# Patient Record
Sex: Male | Born: 1960 | Race: White | Hispanic: No | Marital: Single
Health system: Midwestern US, Academic
[De-identification: ages and names within clinical notes are randomized; demographics above are authoritative.]

---

## 2022-03-09 ENCOUNTER — Encounter: Admit: 2022-03-09 | Discharge: 2022-03-09 | Payer: BC Managed Care – HMO

## 2022-03-09 ENCOUNTER — Ambulatory Visit: Admit: 2022-03-09 | Discharge: 2022-03-09 | Payer: BC Managed Care – HMO

## 2022-03-09 DIAGNOSIS — J189 Pneumonia, unspecified organism: Secondary | ICD-10-CM

## 2022-03-13 ENCOUNTER — Encounter: Admit: 2022-03-13 | Discharge: 2022-03-13 | Payer: BC Managed Care – HMO

## 2022-03-13 ENCOUNTER — Ambulatory Visit: Admit: 2022-03-13 | Discharge: 2022-03-13 | Payer: BC Managed Care – HMO

## 2022-03-13 DIAGNOSIS — I10 Essential (primary) hypertension: Secondary | ICD-10-CM

## 2022-03-13 DIAGNOSIS — I890 Lymphedema, not elsewhere classified: Secondary | ICD-10-CM

## 2022-03-13 DIAGNOSIS — E785 Hyperlipidemia, unspecified: Secondary | ICD-10-CM

## 2022-03-13 DIAGNOSIS — E872 Acidosis: Secondary | ICD-10-CM

## 2022-03-13 DIAGNOSIS — L0390 Cellulitis, unspecified: Secondary | ICD-10-CM

## 2022-03-13 DIAGNOSIS — D72829 Elevated white blood cell count, unspecified: Secondary | ICD-10-CM

## 2022-03-13 DIAGNOSIS — E119 Type 2 diabetes mellitus without complications: Secondary | ICD-10-CM

## 2022-03-13 DIAGNOSIS — J189 Pneumonia, unspecified organism: Secondary | ICD-10-CM

## 2022-03-14 ENCOUNTER — Inpatient Hospital Stay: Admit: 2022-03-14 | Discharge: 2022-03-14 | Payer: BC Managed Care – HMO

## 2022-03-14 ENCOUNTER — Encounter: Admit: 2022-03-14 | Discharge: 2022-03-14 | Payer: BC Managed Care – HMO

## 2022-03-14 ENCOUNTER — Inpatient Hospital Stay: Admit: 2022-03-14 | Payer: BC Managed Care – HMO

## 2022-03-14 DIAGNOSIS — I10 Essential (primary) hypertension: Secondary | ICD-10-CM

## 2022-03-14 DIAGNOSIS — E119 Type 2 diabetes mellitus without complications: Secondary | ICD-10-CM

## 2022-03-14 DIAGNOSIS — A419 Sepsis, unspecified organism: Secondary | ICD-10-CM

## 2022-03-14 LAB — CBC AND DIFF
ABSOLUTE BASO COUNT: 0 K/UL (ref 0–0.20)
ABSOLUTE EOS COUNT: 0.1 K/UL (ref 0–0.45)
ABSOLUTE MONO COUNT: 0.8 K/UL — ABNORMAL HIGH (ref 0–0.80)
PLATELET COUNT: 225 K/UL (ref 150–400)
RBC COUNT: 4.3 M/UL — ABNORMAL LOW (ref 4.4–5.5)
WBC COUNT: 14 K/UL — ABNORMAL HIGH (ref 4.5–11.0)

## 2022-03-14 LAB — COMPREHENSIVE METABOLIC PANEL
ALK PHOSPHATASE: 368 U/L — ABNORMAL HIGH (ref 25–110)
ALT: 99 U/L — ABNORMAL HIGH (ref 7–56)
ANION GAP: 11 K/UL — ABNORMAL HIGH (ref 3–12)
AST: 121 U/L — ABNORMAL HIGH (ref 7–40)
BLD UREA NITROGEN: 16 mg/dL (ref 7–25)
CALCIUM: 8.8 mg/dL (ref 8.5–10.6)
CHLORIDE: 94 MMOL/L — ABNORMAL LOW (ref 98–110)
CO2: 33 MMOL/L — ABNORMAL HIGH (ref 21–30)
CREATININE: 0.7 mg/dL (ref 0.4–1.24)
EGFR: >60 mL/min — ABNORMAL LOW (ref >60)
GLUCOSE,PANEL: 194 mg/dL — ABNORMAL HIGH (ref 70–100)
POTASSIUM: 3.5 MMOL/L — ABNORMAL LOW (ref 3.5–5.1)

## 2022-03-14 LAB — POC GLUCOSE
POC GLUCOSE: 222 mg/dL — ABNORMAL HIGH (ref 70–100)
POC GLUCOSE: 197 mg/dL — ABNORMAL HIGH (ref 70–100)
POC GLUCOSE: 160 mg/dL — ABNORMAL HIGH (ref 70–100)
POC GLUCOSE: 155 mg/dL — ABNORMAL HIGH (ref 70–100)

## 2022-03-14 LAB — SED RATE: ESR: 71 mm/h — ABNORMAL HIGH (ref 0–20)

## 2022-03-14 LAB — C REACTIVE PROTEIN (CRP): C-REACTIVE PROTEIN: 19 mg/dL — ABNORMAL HIGH (ref <1.0)

## 2022-03-14 MED ORDER — OXYCODONE 10 MG PO TAB
5 mg | ORAL | 0 refills | Status: AC | PRN
Start: 2022-03-14 — End: ?
  Administered 2022-03-14 – 2022-03-15 (×2): 5 mg via ORAL

## 2022-03-14 MED ORDER — ACETAMINOPHEN 325 MG PO TAB
650 mg | ORAL | 0 refills | Status: AC | PRN
Start: 2022-03-14 — End: ?
  Administered 2022-03-14 – 2022-03-16 (×3): 650 mg via ORAL

## 2022-03-14 MED ORDER — ONDANSETRON HCL (PF) 4 MG/2 ML IJ SOLN
4 mg | INTRAVENOUS | 0 refills | Status: AC | PRN
Start: 2022-03-14 — End: ?
  Administered 2022-03-19: 16:00:00 4 mg via INTRAVENOUS

## 2022-03-14 MED ORDER — DEXTROSE 50 % IN WATER (D50W) IV SYRG
12.5-25 g | INTRAVENOUS | 0 refills | Status: AC | PRN
Start: 2022-03-14 — End: ?

## 2022-03-14 MED ORDER — AMLODIPINE 5 MG PO TAB
5 mg | Freq: Every day | ORAL | 0 refills | Status: AC
Start: 2022-03-14 — End: ?
  Administered 2022-03-15 – 2022-03-24 (×9): 5 mg via ORAL

## 2022-03-14 MED ORDER — ATORVASTATIN 40 MG PO TAB
40 mg | Freq: Every evening | ORAL | 0 refills | Status: AC
Start: 2022-03-14 — End: ?
  Administered 2022-03-15: 03:00:00 40 mg via ORAL

## 2022-03-14 MED ORDER — SENNOSIDES-DOCUSATE SODIUM 8.6-50 MG PO TAB
1 | Freq: Every day | ORAL | 0 refills | Status: AC | PRN
Start: 2022-03-14 — End: ?

## 2022-03-14 MED ORDER — ONDANSETRON 4 MG PO TBDI
4 mg | ORAL | 0 refills | Status: AC | PRN
Start: 2022-03-14 — End: ?

## 2022-03-14 MED ORDER — TORSEMIDE 20 MG PO TAB
40 mg | Freq: Two times a day (BID) | ORAL | 0 refills | Status: AC
Start: 2022-03-14 — End: ?
  Administered 2022-03-14 – 2022-03-24 (×18): 40 mg via ORAL

## 2022-03-14 MED ORDER — CARVEDILOL 25 MG PO TAB
25 mg | Freq: Two times a day (BID) | ORAL | 0 refills | Status: AC
Start: 2022-03-14 — End: ?
  Administered 2022-03-15 – 2022-03-17 (×5): 25 mg via ORAL

## 2022-03-14 MED ORDER — INSULIN ASPART 100 UNIT/ML SC FLEXPEN
0-12 [IU] | Freq: Before meals | SUBCUTANEOUS | 0 refills | Status: AC
Start: 2022-03-14 — End: ?
  Administered 2022-03-14: 18:00:00 2 [IU] via SUBCUTANEOUS

## 2022-03-14 MED ORDER — VANCOMYCIN PHARMACY TO MANAGE
1 | 0 refills | Status: DC
Start: 2022-03-14 — End: 2022-03-14

## 2022-03-14 MED ORDER — INSULIN GLARGINE 100 UNIT/ML (3 ML) SC INJ PEN
30 [IU] | Freq: Two times a day (BID) | SUBCUTANEOUS | 0 refills | Status: AC
Start: 2022-03-14 — End: ?
  Administered 2022-03-15 – 2022-03-18 (×2): 30 [IU] via SUBCUTANEOUS

## 2022-03-14 MED ORDER — ENOXAPARIN 40 MG/0.4 ML SC SYRG
40 mg | Freq: Two times a day (BID) | SUBCUTANEOUS | 0 refills | Status: AC
Start: 2022-03-14 — End: ?
  Administered 2022-03-15 – 2022-03-18 (×4): 40 mg via SUBCUTANEOUS

## 2022-03-14 MED ORDER — GUAIFENESIN 600 MG PO TA12
600 mg | Freq: Two times a day (BID) | ORAL | 0 refills | Status: AC
Start: 2022-03-14 — End: ?
  Administered 2022-03-14 – 2022-03-24 (×20): 600 mg via ORAL

## 2022-03-14 MED ORDER — POLYETHYLENE GLYCOL 3350 17 GRAM PO PWPK
1 | Freq: Every day | ORAL | 0 refills | Status: AC | PRN
Start: 2022-03-14 — End: ?

## 2022-03-14 MED ORDER — PIPERACILLIN/TAZOBACTAM 4.5 G/100ML NS IVPB (MB+)
4.5 g | INTRAVENOUS | 0 refills | Status: AC
Start: 2022-03-14 — End: ?
  Administered 2022-03-14 – 2022-03-20 (×44): 4.5 g via INTRAVENOUS

## 2022-03-14 MED ORDER — ALBUTEROL SULFATE 2.5 MG/0.5 ML IN NEBU
2.5 mg | RESPIRATORY_TRACT | 0 refills | Status: AC | PRN
Start: 2022-03-14 — End: ?

## 2022-03-14 MED ORDER — VANCOMYCIN 2,000 MG IVPB
15 mg/kg | Freq: Two times a day (BID) | INTRAVENOUS | 0 refills | Status: DC
Start: 2022-03-14 — End: 2022-03-14

## 2022-03-14 MED ORDER — ALLOPURINOL 100 MG PO TAB
300 mg | Freq: Every day | ORAL | 0 refills | Status: AC
Start: 2022-03-14 — End: ?
  Administered 2022-03-15 – 2022-03-24 (×9): 300 mg via ORAL

## 2022-03-14 MED ORDER — MELATONIN 5 MG PO TAB
5 mg | Freq: Every evening | ORAL | 0 refills | Status: AC | PRN
Start: 2022-03-14 — End: ?

## 2022-03-14 MED ADMIN — SODIUM CHLORIDE 0.9 % IV SOLP [27838]: 250 mL | INTRAVENOUS | @ 20:00:00 | Stop: 2022-03-14 | NDC 00338004902

## 2022-03-14 NOTE — Consults
Cardiothoracic Surgery Consult  Date of Service: 03/14/2022    Requesting Physician: Dr. Charolett Bumpers  Consulting Physician:[name]  Consult Performed By:  Italy Sedale Jenifer, DO    HPI:               61yo male admitted from OSH with persistent bacteremia and imaging concerning for mediastinitis and phlegmon of the [facility] joint/lateral neck. Patient notes hx of diabetes. Reports pain and swelling of the right neck that has worsened over the last week. Denies chest pain, denies shortness of breath. Denies hx of similar episodes of current symptoms.     Impression:  There are no active hospital problems to display for this patient.       Plan:  Imaging consistent with SC joint infection  Would recommend infectious disease consultation with aggressive IV abx  Will follow clinical course and physical exam, may ultimately require surgical debridement.    C Georgeanna Radziewicz  CTS Fellow        No past medical history on file.    No past surgical history on file.     No medications prior to admission.        Allergies   Allergen Reactions   ? Sulfur ITCHING       No family history on file.    Social History     Socioeconomic History   ? Marital status: Single       ROS:  Constitutional: Negative for Fatigue, Weight Change, Fevers  Eyes, Ears, Nose And Throat: Negative for Change in vision, Change in Hearing   Cardiovascular: Negative for Chest Pain, Palpitations, Swelling in ankles  Respiratory: Negative for Cough, Shortness of Breath, wheezing  Gastrointestinal: Negative for Nausea, indigestion, Diarrhea, Constipation, Rectal Bleeding   Neurological: Positive for headache   Psychological: Negative for depression or anxiety  Musculoskeletal: Negative for Pain or Swelling in joints  Genitourinary: Negative for Pain with urination, Incontinence of urine, urinary frequency    Skin: Negative for any unusual rash  Endocrine: Negative for any hair, skin, or nail changes, Unusual hunger or thirst    Physical Exam:  Temp: 37.2 ?C (99 ?F) (08/19 1034)  Pulse: 74 (08/19 1034)  Respirations: 20 PER MINUTE (08/19 1034)  BP: 127/72 (08/19 1034)      GENERAL:   A&O x 3, NAD, appears stated age  HEENT:  Right supraclavicular space with significant induration and tenderness in comparison to the left.  NECK:  No masses, normal ROM, trachea midline  CARDIOVASCULAR:  RRR  LUNGS:  chest symmetrical with respirations  ABDOMEN:  Soft, NT  SKIN:   No lesions, rashes or wounds  MUSCULOSKELETAL:  No muscle atrophy, normal joint range of motion   NEUROLOGIC:  Grossly intact     Results for orders placed or performed during the hospital encounter of 03/14/22 (from the past 24 hour(s))   POC GLUCOSE    Collection Time: 03/14/22 11:33 AM   # # Low-High    Glucose, POC 222 (H) 70 - 100 MG/DL

## 2022-03-14 NOTE — H&P (View-Only)
Admission History and Physical Examination      Name:  Jon Pope                                             MRN:  [id number]   Admission Date:  03/14/2022                     Assessment/Plan:    Principal Problem:    Streptococcal bacteremia  Active Problems:    Type 2 diabetes mellitus, with long-term current use of insulin (HCC)    Primary hypertension    HLD (hyperlipidemia)    Gout    Lymphedema due to venous insufficiency    Chronic right shoulder pain    Left lower lobe pneumonia      61 y.o. male with PMH   HTN, Type 2 DM, HLD, Obesity, chronic right shoulder pain and Lymphedema/BLE edema, venous insufficiciency presented to OSH 8/13 with 3 days of generalized  malaise, weakness, nausea/vomiting, fevers, some neck pain, headaches,congestion and coughing.  Patient found to have a LLL pneumonia, Group G Strep Bacteremia, Right neck cellulitis/phlegmon, with concern for anterior Mediastinitis and osteomyelitis.    Group G Strep bacteremia  - WBC (8/13) 10.16-->  - Procal (8/13) 1.88--> 0.37 ( 8/18)  - Lacate ( 8/13) 2.8-->1.5 ( 8/14)  - Covid (8/13) negative  - Blood culture ( 8/13) aerobic and anerobic culture positive for Group G Streptococcus (pansensitive- Amp/Ceftraxone/Levofloxacin/PCN/Vanco)  - OSH Blood culture ( 8/15)- no growth to date  - UA ( 8/13) with No overt concern for infection. +1 pro, +1blood, neg nitrite and leukocytes.  - Echo ( 8/18) with LVEF 65-70%. No evidence of valvular abnormalities on spectral doppler.  Consider TEE for improved sensitivity for detection of valvular vegetations if clinically indicated. No pericardial effusion  - per documentation on 8/18, patient continued to fever despite treatment.  - Antibiotics         - Levaquin 8/13- 8/18       - Rocephin ( 8/18)       -Azithomycin 500mg  PO  ( 8/18) added for PNA        -Vancomycin IV ( 8/18-*8/19)       -Zosyn ( 8/18)  Plan  - ID consulted  - Continue Zosyn ( 8/18- present)  - Repeat blood cultures, CRP and admission labs  - Will need to follow up with OSH blood culture draw from 8/15 and 8/18 ( [facility] on [address])  - Per ID, obtain Right neck ultrasound, Panorex and TEE      Right and Posterior neck cellulitis/phelgmon  Osteomyelitis/ mediastinitis  Cervical Lymphadenopathy  -Reports neck pain with notable erythema ( marked) for 3 day prior to admission at the OSH 8/13  -CT head (03/08/2022) with no acute intracranial abnormality.  -CT soft tissue neck wo/w contrast (03/09/2022) mild skin thickening with inflammatory stranding and muscle edema involving the right side of the neck.  Findings are suspicious for cellulitis/myositis secondary to an infectious etiology.  No definitive drainable abscess at this time.   -MRI cervical and thoracic spine with right neck infection extending into the prevertebral soft tissues and anterior mediastinum without definitive drainable abcess.  -OSH MRI c-spine w/wo ( 8/18) with skeletal hyperostosis and prevertebral edema and disruption of the anterior longitudinal ligament at the level of the C6-C7.  -OSH  MRI Thoracic spine w/wo ( 8/18) with infection within the right carotid space and right side of the neck there is extension into the thoracic inlet with a small right pleural effusion.  -OSH CT chest ( 8/18) revealed phlegmon involving the right side of the neck encasing the manubrium of the sternum and extending into the anterior mediastrinum and right lung apex. There are foci of air within the anterior mediastinum. Sclerosis and erosions involving the posterior cortex of the manubrium appreciated. Finding are suspicious of osteomyelitis and mediastinitis.  -OSH CT abdomen/pelvis (8/18) with hepatomegaly (23.8 cm) with moderate steatosis.  Mild hepatic surface nodularity.  Enlarged spleen (18 cm).  Pancreas and adrenal glands unremarkable.  Cholelithiasis.  No hydronephrosis.  No bowel obstruction or free air.  Normal appendix.  Mild gastric mucosal thickening.  No significant small bowel dilatation.  Mild arthrosclerotic disease within the abdominal aorta.  Subcentimeter retroperitoneal nodules.  There is a 1.2 cm right external iliac node.  Tiny fat-containing umbilical hernia.  Unremarkable urinary bladder.  Unremarkable prostate and seminal vesicles.  Intramedullary rod within the left femur.  Severe degenerative changes within the lower lumbar spine.  -CRP elevated >32 on admission-->( 8/14) 28.34-> 21.08 ( 8/18)  - ESR 71/CRP 19.46 on admission  - slight pain to palpation and with movement  Plan  - CTS consulted  - Continue Abx as above    LLL PNA  Cough  - OSH BNP 35  -OSH 2 view chest x-ray (03/08/2022) with questionable opacity in the left lower lobe and right subhilar space  -OSH 1 view chest x-ray (8/17) with no radiographic evidence of acute cardiopulmonary process  - OSH Sputum gram stain ( 8/17) with few white cell, rare epithelial cells, few mixed bacterial morphotypes present. ( preliminary)  - OSH Sputum culture (8/17) in process  - CT chest as above  - Patient with continued cough and mild sputum production based on documentation from 8/18  Plan  - RT protocol  - Incentive spirometry while awake  - Repeat chest xray   - Continue abx as above and supportive care      Acute on Chronic Right shoulder pain  -Worsening of his chronic right shoulder pain prior to admission.   -OSH x-ray of right shoulder (03/08/2022) with no radiographic evidence of an acute osseous abnormality or significant degenerative change.  -See MRI as above  Plan  - Tylenol and oxycodone PRN  - ok for heating pad if needed      Moderate Steatosis  Hepatomagaly  Transaminitis  - Not present prior to admission, however no LFTs noted in previous labs  - AST 121; ALT 99; Alk phos 368. Tbili normal.  - OSH CT abdomen with abdomen/pelvis (8/18) with hepatomegaly (23.8 cm) with moderate steatosis.  Mild hepatic surface nodularity  - no abdominal pain, possible due to medications.  - Denies drinking or use of other substance  Plan  - Abdominal ultrasound in AM  - Acute hepatitis, HIV and Syphilis and  INR  in AM  - Monitor      Type 2 DM.   -Hemoglobin A1c  7.5.   - PTA regimen: Glipizide 10mg  daily, Levemir 40 units BID, Ozempic 1 mg weekly.  - Continue:  Lantus 30 units BID and [facility] insulin  - AC and HS BS    Hypertension  - Continued on PTA Lisinopril 40 mg daily, Amlidipine 5 mg daily and Coreg 25 mg BID ( with holding parameter     HLD  -  Continue on PTA Atorvastatin 40 mg qHS  - Hold Gemfibrozil 600 mg BID  - Lipid profile in AM    Bilateral Lower extremity edema  Lymphedema  - per patient he has undergone ultrasound x2 that were negative for DVTs ( clots)  - continue PTA Torsemide 40 mg BID and potassium 20 mEq daily  - Elevate bilateral lower extremitites  - Thigh high TED hose bilaterally if able to tolerate or wrap.     History of gout  -  Continued on PTA allopurinol 300mg  PO daily    FEN:  - No IVF  - Electrolytes reviewed  - Diabetic diet    DVT Prophylaxis: Lovenox    Code Status: Full code ( discussed)  Brother: Cambell Yeakey [phone])  Brother: Gerlene Burdock [phone])    Disposition: Inpatient admission. Consulted ID and CTS. Imaging requested from [facility].    Seen by and Discussed with Dr. Charolett Bumpers  _____________________________________________________________________________  Primary Care Physician: Lona Kettle  Verified    Chief Complaint:  Generalized pain found to have bacteremia and concern for Mediastinitis and Osteomyelitis    History of Present Illness: Jon Pope is a 61 y.o. male  PMH  HTN, Type 2 DM, HLD, Obesity, Chronic right shoulder pain and Lymphedema/BLE edema, venous insufficiciency presented to OSH 8/13 with 3 days of generalized  malaise, weakness, nausea/vomiting, fevers, some neck pain, headaches,congestion and coughing. Per record review, patient started he hurt from head to toe with body aches and joint pain. Patient found to have a LLL pneumonia, Group G Strep Bacteremia, Right neck cellulitis/phlegmon, with concern for anterior Mediastinitis and osteomyelitis.    OSH lab on admission significant for eleveated WBC ( 10.16), Neurophil % 85.6,  NA 130, Cr 0.97,  CRP>32, Procal 1.88,  Lactate 2.6, Covid negative in ED. Treated with IVF and started on Levaquin qday for CAP.  CRP slowly trended down, Pro-Cal trended normal, lactic acidosis resolved.  WBC trend was labile.  Blood cultures resulted positive for group G Streptococcus which is pansensitive so patient continued on Levaquin.  Patient later continued to fever.  Therefore Rocephin and azithromycin was added (8/18).  Echo at outside hospital with normal EF and no vegetation seen.  CT chest abdomen pelvis, MRI C/T-spine with concern for right-sided cellulitis, mediastinitis and osteomyelitis.  Patient transitioned to vancomycin and Zosyn (8/18)    Patient currently endorses dull pain to the top of his head, posterior neck and right shoulder rating it  7/10 pain.  Patient endorsed pain to the right supine portion of his forearm that has now resolved.  Denies visual changes, sore throat, chest pain, shortness of breath, abdominal pain, dysuria numbness and tingling his extremities.  Last bowel movement was 8/19 upon transfer.      Review of Systems:  A 14 point review of systems was negative except for: As above      Past medical history: HTN, Type 2 DM, HLD, Obesity, Chronic right shoulder pain and Lymphedema/BLE edema, venous insufficiciency   PSH: Left leg Femur fx s/p intervention/implant and joint prosthesis and left rotator cuff tear/ repair 04/2019,   left shoulder arthroscopy 02/2019    Social history: Denies Drinking, smoking and use of other substances.    Family history: mother with hypertension      Immunizations (includes history and patient reported):   There is no immunization history on file for this patient.        Allergies:  Sulfur    Medications:  Amlodipine  5 mg p.o. daily  Atorvastatin 40 mg p.o. nightly  Glipizide 10 mg p.o. daily  Lisinopril 40 mg p.o. daily  Coreg 25 mg twice daily  Torsemide 40 mg BID   Potassium 20 mEq daily  Levemir 40 units subq  BID  Ozempic 1 mg sq weekly  Allopurinol 300mg  PO daily  Fibrate 600 mg BID        Physical Exam:  Vital Signs: Last Filed In 24 Hours Vital Signs: 24 Hour Range   BP: 127/72 (08/19 1034)  Temp: 37.2 ?C (99 ?F) (08/19 1034)  Pulse: 74 (08/19 1034)  Respirations: 20 PER MINUTE (08/19 1034)  SpO2: 95 % (08/19 1034)  O2 Device: None (Room air) (08/19 1034)  Height: 172.7 cm (5' 8) (08/18 1543) BP: (127)/(72)   Temp:  [37.2 ?C (99 ?F)]   Pulse:  [74]   Respirations:  [20 PER MINUTE]   SpO2:  [95 %]   O2 Device: None (Room air)   Intensity Pain Scale (Self Report): 7 (03/14/22 1034)      General appearance: alert, cooperative, no distress and moderately obese  Head: Normocephalic, without obvious abnormality, atraumatic  Eyes: conjunctivae/corneas clear. PERRL, EOM's intact.  Mouth: missing teeth/ poor dentition  Heart: regular rate and rhythm, S1, S2 normal, no murmur, click, rub or gallop  Abdomen: soft, non-tender. Bowel sounds normal. Protuberant  Extremities: extremities normal, atraumatic, no cyanosis, bilateral LE +3 edema  Neurologic: Grossly normal  Peripheral pulses:  2+ and symmetric  Cap Refill:  < 3 secs  Skin: Skin color, texture, turgor normal. Redness/erythemia to anterior and posterior chest ( marker) slightly tender to palpation  Psych:   Iappropriate    Lab/Radiology/Other Diagnostic Tests:  Pertinent labs reviewed     EKG Reviewed, Transfer records reviewed. Echo reviewed    Ismael Treptow Darcey Nora, APRN-NP  Pager [phone]

## 2022-03-14 NOTE — Consults
Infectious Diseases Initial Consult Note      Today's Date:  03/14/2022  Admission Date: 03/14/2022    Reason for this consultation: Direct transfer for OSH with group G strep bacteremia and ?CT with Right posterior neck cellulutis/phelgmon extendening to the anterior mediastinum and air within the mediastinum suspecious for osteomyelitis and mediatinitis.    Assessment:     61 y.o. with history of IDDM, HLD who was transferred from [facility] [place] on 03/14/22 for worsening R neck cellulitis. Pt initially presented to OSH with complaints of R side neck pain and was found to have Group C Streptococcus bacteremia. Initial CT neck (03/09/22) showed mild skin thickening with inflammatory stranding and muscle edema of R side of neck. Repeat blood cx remained positive on 03/10/22. His WBC continued to increase prompting MRI C spine (03/13/22) which showed infection w/i the R carotid space and R side of the neck with extension into preverterbal soft tissuee and mediastinum. CT chest (03/13/22) showed phlegmon involving R side of the neck encasing manubrium and extending into anterior mediastinum and R lung apex. He was subsequently transferred to [place].    Pt has been on targeted antibiotics for Group C Strep infection but has had worsening of infection based on repeat imagining. Suspect worsening is either due to lack of source control or possibly this is a polymicrobial infection that wasn't being treated by levofloxacin alone. An alternative explanation would be the presence of a thrombus. Will continue Pip/tazo for now. Stopped Vancomycin. Appreciate CTS assistance will need to monitor exam closely and consider repeat imaging if he fails to improve. Given pre-vertebral extension may need to engage spine surgery as well.    ID problem list:  1. R neck cellulitis with possibly early mediastinitis  2. Group C bacteremia  3. Poor dentition  4. IDDM  5. HLD    Recommendations:     1. Continue Pip/tazo  2. Stopped Vancomycin given prior cx growing Group C Streptococcus only  3. Obtain ultrasound of R neck to evaluate for possible septic thrombophlebitis, if indeterminate may need CTA neck  4. Obtain Panorex, based on results may need dental consult  5. Obtain TEE to rule out endocarditis given repeat positive blood cultures at OSH  6. Follow up repeat blood cx at OSH and at [facility]  7. Monitor minimum weekly CBC w/ diff and CMP while inpatient given antimicrobial drug therapy which requires intensive monitoring for toxicity   8. Pt discussed with CTS, [name]     Thank you for allowing ID to participate in the care of this patient. Please page/voalte with questions or concerns.    High complexity of medical decision making given bacteremia with worsening R neck infection which potentially can be life threatening.       History of Present Illness    Jon Pope is a 61 y.o. with history of IDDM, HLD who was transferred from [facility] [place] on 03/14/22 for worsening R neck cellulitis.    Pt reports about 3 days prior to presentation to OSH he develop pain in the R side of his neck with radiation to shoulder and chest. He denies prior trauma to area. He notes associated generalized malaise and chills. He also reports having nausea which he states would come at the time of his chills. He states he has had a long standing issue with his teeth and notes he has many broken teeth.    Pt admitted on 03/08/22 to [facility] [place],  initial imaging was reportedly concerning or LLL pneumonia. He as started on empiric Levofloxacin. Admission blood cultures grew group C Streptococcus. Due to neck pain CT neck was obtained (03/09/22) and showed mild skin thickening with inflammatory stranding and muscle edema of R side of neck. TTE was technically difficult but showed no over valvular disease. His repeat blood cx remained positive on 03/10/22. From chart review appears he did okay initially but subsequently had increasing WBC up 15.48 on 03/13/22 antibiotics were changed to Ceftriaxone.  Additional imaging was obtained including MRI C spine which showed infection w/i the R carotid space and R side of the neck with extension into preverterbal soft tissuee and mediastinum. Based on this a CT chest/abd/eplvis was obtained which showed phlegmon involving the R side of the neck encasing the manubrium of the sternum and extending to the anterior mediastinum and R lung apex, sclerosis and erosions involving posterior cortex of the manubrium. His antibiotics were broadened to Vancomycin and Pip/tazo and he was transferred to [place].    Currently he reports his neck feels better compared to admission.       Antimicrobial Start date End date   Levofloxacin 8/13 8/18   Ceftriaxone 8/18 x1    Pip/tazo 8/18 Active   Vancomcyin 8/18 8/19                  Estimated Creatinine Clearance: 137.5 mL/min (based on SCr of 0.74 mg/dL).    Past Medical History     Medical History:   Diagnosis Date   ? Diabetes [facility])        Past Surgical History   No past surgical history on file.    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 Smoking status: Not on file   ? Smokeless tobacco: Not on file   Substance Use Topics   ? Alcohol use: Not on file     Family History   No family history on file.    Allergies     Allergies   Allergen Reactions   ? Sulfur ITCHING       Review of Systems   A comprehensive 14-point review of systems was negative with exception of the review of systems as noted in the history of present illness.    Medications   Scheduled Meds:[START ON 03/15/2022] allopurinoL (ZYLOPRIM) tablet 300 mg, 300 mg, Oral, QDAY  [START ON 03/15/2022] amLODIPine (NORVASC) tablet 5 mg, 5 mg, Oral, QDAY  atorvastatin (LIPITOR) tablet 40 mg, 40 mg, Oral, QHS  carvediloL (COREG) tablet 25 mg, 25 mg, Oral, BID  enoxaparin (LOVENOX) syringe 40 mg, 40 mg, Subcutaneous, BID  guaiFENesin LA (MUCINEX) tablet 600 mg, 600 mg, Oral, BID  insulin aspart (U-100) (NOVOLOG FLEXPEN U-100 INSULIN) injection PEN 0-12 Units, 0-12 Units, Subcutaneous, ACHS (22)  insulin glargine (LANTUS SOLOSTAR U-100 INSULIN) injection PEN 30 Units, 30 Units, Subcutaneous, BID  piperacillin/tazobactam (ZOSYN) 4.5 g in sodium chloride 0.9% (NS) 100 mL IVPB (MB+), 4.5 g, Intravenous, Q6H*  torsemide (DEMADEX) tablet 40 mg, 40 mg, Oral, BID(9-17)    Continuous Infusions:  PRN and Respiratory Meds:acetaminophen Q4H PRN, albuterol sulfate 0.5% Q4H PRN, dextrose 50% (D50) IV PRN, melatonin QHS PRN, ondansetron Q6H PRN **OR** ondansetron (ZOFRAN) IV Q6H PRN, oxyCODONE Q6H PRN, polyethylene glycol 3350 QDAY PRN, sennosides-docusate sodium QDAY PRN      Physical Examination  Vital Signs: Most Recent                  Vital Signs: 24 Hour Range   BP: 126/77 (08/19 1500)  Temp: 36.9 ?C (98.4 ?F) (08/19 1500)  Pulse: 74 (08/19 1500)  Respirations: 18 PER MINUTE (08/19 1713)  SpO2: 92 % (08/19 1713)  O2 Device: None (Room air) (08/19 1713) BP: (126-127)/(72-77)   Temp:  [36.9 ?C (98.4 ?F)-37.2 ?C (99 ?F)]   Pulse:  [74]   Respirations:  [18 PER MINUTE-20 PER MINUTE]   SpO2:  [92 %-95 %]   O2 Device: None (Room air)     General appearance: awake, alert, no distress  HENT: mucus membranes moist, no oral lesions/thrush, poor dentition with many broken teeth  Eyes: EOM grossly intact, Conj nl  Neck: R neck with induration and firmness extending to R SC joint, areas is TTP  Lungs: no wheezing, rhonchi, rales appreciated  Heart: Regular rhythm, normal rate, with no murmur, rub, gallop  Abdomen: soft, non-tender, non-distended, normoactive bowel sounds  Ext:  Bilateral lower extremity edema R>L  Skin: no rashes, some ecchymosis on chest wall  Neuro: interactive, moves all extremities  Psych: normal mood and affect    Lines: PIV    Lab Review   Hematology  Recent Labs     03/14/22  1350   WBC 14.3*   HGB 13.3*   HCT 38.2* PLTCT 225     Chemistry  Recent Labs     03/14/22  1350   NA 138   K 3.5   CL 94*   CO2 33*   BUN 16   CR 0.74   GLU 194*   CA 8.8   ALBUMIN 3.1*   ALKPHOS 368*   AST 121*   ALT 99*   TOTBILI 0.7       Microbiology, Radiology and other Diagnostics Review   Microbiology:  -03/08/22 blood cx ([facility]): Group C Streptococcus (S to penicillin, levofloxacin, CTX)  -03/10/22 blood cx ([facility]): Group C Streptococcus  -03/13/22 blood cx ([facility]): pending  -03/14/22 blood cx x2: pending      Microbiology personally reviewed by myself and findings summarized in my assessment.     Radiology:  -03/09/22 CT neck: mild skin thickening with inflammatory stranding and muscle edema of R side of neck  -03/09/22 TTE: technically difficult study, no clinically significant valvular disease  -03/13/22 CT chest w/o: phlegmon involving R side of the neck encasing manubrium and extending into anterior mediastinum and R lung apex  -03/13/22 MRI C spine: infection w/i the R carotid space and R side of the neck with extension into preverterbal soft tissuee and mediastinum      Radiology images personally reviewed by myself and findings summarized in my assessment.        Jon Nick, MD  Division of Infectious Diseases  Please contact preferentially via Voalte or [employer]Connect

## 2022-03-14 NOTE — Progress Notes
Vascular Access Team consulted to obtain lab specimen.    Ultrasound Used: Yes    How Many Attempts: 1    1 set of BC's drawn from R[facility]. Tubes labeled and sent to lab.

## 2022-03-14 NOTE — Progress Notes
RT Adult Assessment Note    NAME:Jon Pope             MRN: [id number]             DOB:21-Mar-1961          AGE: 61 y.o.  ADMISSION DATE: 03/14/2022             DAYS ADMITTED: LOS: 0 days    RT Treatment Plan:  Protocol Plan: Medications  Albuterol: MDI PRN         Additional Comments:  Impressions of the patient: patient looked comfortable  Intervention(s)/outcome(s): RT Eval  Patient education that was completed: none  Recommendations to the care team: continue to care    Vital Signs:  Pulse:    RR: 18 PER MINUTE  SpO2: 92 %  O2 Device: None (Room air)  Liter Flow:    O2%:    Breath Sounds:    Respiratory Effort:

## 2022-03-15 MED ADMIN — POTASSIUM CHLORIDE 20 MEQ PO TBTQ [35943]: 40 meq | ORAL | @ 17:00:00 | Stop: 2022-03-15 | NDC 00832532511

## 2022-03-15 MED ADMIN — POTASSIUM CHLORIDE 20 MEQ PO TBTQ [35943]: 40 meq | ORAL | @ 21:00:00 | Stop: 2022-03-15 | NDC 00832532511

## 2022-03-16 ENCOUNTER — Encounter: Admit: 2022-03-16 | Discharge: 2022-03-16 | Payer: BC Managed Care – HMO

## 2022-03-16 ENCOUNTER — Inpatient Hospital Stay: Admit: 2022-03-16 | Discharge: 2022-03-16 | Payer: BC Managed Care – HMO

## 2022-03-16 DIAGNOSIS — R7881 Bacteremia: Secondary | ICD-10-CM

## 2022-03-16 DIAGNOSIS — E119 Type 2 diabetes mellitus without complications: Secondary | ICD-10-CM

## 2022-03-16 DIAGNOSIS — I10 Essential (primary) hypertension: Secondary | ICD-10-CM

## 2022-03-16 MED ORDER — PROPOFOL INJ 10 MG/ML IV VIAL
INTRAVENOUS | 0 refills | Status: DC
Start: 2022-03-16 — End: 2022-03-16
  Administered 2022-03-16: 18:00:00 50 mg via INTRAVENOUS

## 2022-03-16 MED ORDER — LIDOCAINE HCL 4 % (40 MG/ML) MM SOLN
Freq: Once | TOPICAL | 0 refills | Status: CP
Start: 2022-03-16 — End: ?
  Administered 2022-03-16: 18:00:00 50.000 mL via TOPICAL

## 2022-03-16 MED ORDER — PROPOFOL 10 MG/ML IV EMUL 20 ML (INFUSION)(AM)(OR)
INTRAVENOUS | 0 refills | Status: DC
Start: 2022-03-16 — End: 2022-03-16
  Administered 2022-03-16: 18:00:00 80 ug/kg/min via INTRAVENOUS

## 2022-03-16 MED ORDER — SODIUM CHLORIDE 0.9 % IV SOLP (OR) 500ML
INTRAVENOUS | 0 refills | Status: DC
Start: 2022-03-16 — End: 2022-03-16
  Administered 2022-03-16: 18:00:00 via INTRAVENOUS

## 2022-03-16 MED ORDER — LIDOCAINE (PF) 20 MG/ML (2 %) IJ SOLN
INTRAVENOUS | 0 refills | Status: DC
Start: 2022-03-16 — End: 2022-03-16
  Administered 2022-03-16: 18:00:00 100 mg via INTRAVENOUS

## 2022-03-16 MED ADMIN — SODIUM CHLORIDE 0.9 % IJ SOLN [7319]: 50 mL | INTRAVENOUS | @ 17:00:00 | Stop: 2022-03-16 | NDC 00409488850

## 2022-03-16 MED ADMIN — IOHEXOL 350 MG IODINE/ML IV SOLN [81210]: 70 mL | INTRAVENOUS | @ 17:00:00 | Stop: 2022-03-16 | NDC 00407141491

## 2022-03-16 MED ADMIN — SODIUM CHLORIDE 0.9 % IJ SOLN [7319]: 50 mL | INTRAVENOUS | @ 23:00:00 | Stop: 2022-03-16 | NDC 00409488850

## 2022-03-16 MED ADMIN — IOHEXOL 350 MG IODINE/ML IV SOLN [81210]: 80 mL | INTRAVENOUS | @ 23:00:00 | Stop: 2022-03-16 | NDC 00407141491

## 2022-03-16 NOTE — Progress Notes
Pre-Operative Assessment for TEE or Cardioversion    Date of Service:  03/16/2022    Jon Pope is a 61 y.o. y.o. male. With significant medical history, who is referred for TEE Indication: Strep bacteremia .      he has been compliant with his  LMW heparin Missed dose: Noand No  bleeding. he is Negative for: N/A and Positive for: Sleep Apnea/OSA.      GI procedures:20 yrs ago            When: No    Chest pain:  No   SOB: No         Medical History:  Medical History:   Diagnosis Date   ? Diabetes (HCC)    ? HTN (hypertension)         Surgical History:   No past surgical history on file.    Social History            Allergies                                        Allergies   Allergen Reactions   ? Sulfur ITCHING          Current Medications  Current Facility-Administered Medications on File Prior to Encounter   Medication Dose Route Frequency Provider Last Rate Last Admin   ? acetaminophen (TYLENOL) tablet 650 mg  650 mg Oral Q4H PRN [facility], Deirdre M, APRN-NP   650 mg at 03/16/22 0813   ? albuterol sulfate 0.5% nebulizer solution 2.5 mg  2.5 mg Inhalation Q4H PRN Waldrup, Deirdre M, APRN-NP       ? allopurinoL (ZYLOPRIM) tablet 300 mg  300 mg Oral QDAY Waldrup, Deirdre M, APRN-NP   300 mg at 03/16/22 0454   ? amLODIPine (NORVASC) tablet 5 mg  5 mg Oral QDAY Waldrup, Deirdre M, APRN-NP   5 mg at 03/16/22 0981   ? carvediloL (COREG) tablet 25 mg  25 mg Oral BID Enriqueta Shutter M, APRN-NP   25 mg at 03/16/22 1914   ? dextrose 50% (D50) syringe 25-50 mL  12.5-25 g Intravenous PRN [facility], Deirdre M, APRN-NP       ? [Held by Provider] enoxaparin (LOVENOX) syringe 40 mg  40 mg Subcutaneous BID Waldrup, Deirdre M, APRN-NP   40 mg at 03/15/22 7829   ? guaiFENesin LA (MUCINEX) tablet 600 mg  600 mg Oral BID Enriqueta Shutter M, APRN-NP   600 mg at 03/16/22 5621   ? insulin aspart (U-100) (NOVOLOG FLEXPEN U-100 INSULIN) injection PEN 0-12 Units  0-12 Units Subcutaneous ACHS (22) Waldrup, Deirdre M, APRN-NP   2 Units at 03/16/22 1119   ? insulin glargine (LANTUS SOLOSTAR U-100 INSULIN) injection PEN 30 Units  30 Units Subcutaneous BID Dorris Singh, APRN-NP   30 Units at 03/16/22 0809   ? iohexoL (OMNIPAQUE-350) 350 mg/mL injection 70 mL  70 mL Intravenous ONCE Armanda Heritage M, APRN-NP       ? melatonin tablet 5 mg  5 mg Oral QHS PRN [facility], Deirdre M, APRN-NP       ? ondansetron (ZOFRAN ODT) rapid dissolve tablet 4 mg  4 mg Oral Q6H PRN Waldrup, Deirdre M, APRN-NP        Or   ? ondansetron HCL (PF) (ZOFRAN (PF)) injection 4 mg  4 mg Intravenous Q6H PRN Waldrup, Deirdre M, APRN-NP       ?  oxyCODONE tablet 5 mg  5 mg Oral Q6H PRN [facility], Deirdre M, APRN-NP   5 mg at 03/14/22 2209   ? piperacillin/tazobactam (ZOSYN) 4.5 g in sodium chloride 0.9% (NS) 100 mL IVPB (MB+)  4.5 g Intravenous Q6H* Waldrup, Deirdre M, APRN-NP 200 mL/hr at 03/16/22 0807 4.5 g at 03/16/22 0807   ? polyethylene glycol 3350 (MIRALAX) packet 17 g  1 packet Oral QDAY PRN Dorris Singh, APRN-NP       ? sennosides-docusate sodium (SENOKOT-S) tablet 1 tablet  1 tablet Oral QDAY PRN [facility], Deirdre M, APRN-NP       ? sodium chloride PF 0.9% injection 50 mL  50 mL Intravenous ONCE Armanda Heritage M, APRN-NP       ? torsemide [facility]) tablet 40 mg  40 mg Oral BID(9-17) Waldrup, Deirdre M, APRN-NP   40 mg at 03/16/22 4540     No current outpatient medications on file prior to encounter.       Vitals  Estimated body mass index is 42.57 kg/m? as calculated from the following:    Height as of this encounter: 172.7 cm (5' 8).    Weight as of this encounter: 127 kg (280 lb).       Patient appears alert and oriented: Yes  NPO: for greater than 8 hours  Inpatient IV status: Arrived with PIV per right [facility] # 22 with site WNL - NS flush and infusion - PIV per left forearm #20 capped with site WNL    Diagnostic Tests  White Blood Cells   Date Value Ref Range Status   03/16/2022 13.0 (H) 4.5 - 11.0 K/UL Final     Hemoglobin   Date Value Ref Range Status   03/16/2022 12.8 (L) 13.5 - 16.5 GM/DL Final     Hematocrit   Date Value Ref Range Status   03/16/2022 38.0 (L) 40 - 50 % Final     Platelet Count   Date Value Ref Range Status   03/16/2022 261 150 - 400 K/UL Final     Sodium   Date Value Ref Range Status   03/16/2022 136 (L) 137 - 147 MMOL/L Final     Potassium   Date Value Ref Range Status   03/16/2022 3.8 3.5 - 5.1 MMOL/L Final     No results found for: MG  Blood Urea Nitrogen   Date Value Ref Range Status   03/16/2022 12 7 - 25 MG/DL Final     Creatinine   Date Value Ref Range Status   03/16/2022 0.67 0.4 - 1.24 MG/DL Final     Glucose   Date Value Ref Range Status   03/16/2022 177 (H) 70 - 100 MG/DL Final       Last MAC INR Flow Sheet Entry:    Last recorded Lab results:   INR   Date Value Ref Range Status   03/15/2022 1.4 (H) 0.8 - 1.2 Final     No results found for: PTT        Blood Cultures  Resulted Micro Last 72 Hrs    No results found         Last TEE date: None  Last Cardioversion date: None  Echo procedures within the past 30 days:  No results found.      Device Information on File  No results found for: GENERATOR, EPDEVTYP      Additional Comments:  None    Plan:  Dr. Christain Sacramento will plan to proceed with the  TEE.

## 2022-03-16 NOTE — Anesthesia Pre-Procedure Evaluation
Anesthesia Pre-Procedure Evaluation    Name: Jon Pope      MRN: [id number]     DOB: [birth year]     Age: 61 y.o.     Sex: male   _________________________________________________________________________     Procedure Info:   Procedure Information     Date/Time: 03/16/22 1400    Scheduled providers: Stephanie Acre, DO    Procedure: TRANSESOPHAGEAL ECHO    Location: [facility]iology:[facility]          Physical Assessment  Vital Signs (last filed in past 24 hours):  BP: 137/67 (08/21 0738)  Temp: 36.9 ?C (98.4 ?F) (08/21 3244)  Pulse: 65 (08/21 0738)  Respirations: 14 PER MINUTE (08/21 0738)  SpO2: 98 % (08/21 0738)  O2 Device: None (Room air) (08/21 0102)      Patient History   Allergies   Allergen Reactions   ? Sulfur ITCHING        Current Medications    Not on File       Review of Systems/Medical History      Patient summary reviewed  Nursing notes reviewed  Pertinent labs reviewed    PONV Screening: Non-smoker  No history of anesthetic complications  No family history of anesthetic complications        Pulmonary          Pneumonia ( -[facility]H 2 view chest x-ray (03/08/2022) with questionable opacity in the left lower lobe and right subhilar space)        Obstructive Sleep Apnea          Interventions: CPAP; noncompliant      Cardiovascular       Recent diagnostic studies:          echocardiogram           Echo ( 8/18) with LVEF 65-70%. No evidence of valvular abnormalities on spectral doppler.  Consider TEE for improved sensitivity for detection of valvular vegetations if clinically indicated. No pericardial effusion      Exercise tolerance: <4 METS      Hypertension,             PVD (   Lymphedema due to venous insufficiency)      Hyperlipidemia        group G bacteremia and R neck cellulitis with concern of mediastinitis      GI/Hepatic/Renal           Liver disease ( Transaminitis on admission):             Neuro/Psych         Pt had head CT this morning for mental status change, currently A&Ox4      Musculoskeletal         Chronic right shoulder pain        Endocrine/Other       Diabetes, type 2; using insulin      Most recent Hgb A1C:7.2 - 9          Autoimmune disease ( Gout)      Obesity: Class 3 (BMI 40-49.9)        Neck cellulitis   Physical Exam    Airway Findings      Mallampati: III      TM distance: >3 FB      Neck ROM: full      Mouth opening: good    Dental Findings: Negative      Cardiovascular Findings:       Rhythm: regular  Rate: normal    Pulmonary Findings:       Breath sounds clear to auscultation.    Abdominal Findings:       Obese    Neurological Findings:       Alert and oriented x 3    Constitutional findings:       No acute distress       Diagnostic Tests  Hematology:   Lab Results   Component Value Date    HGB 12.8 03/16/2022    HCT 38.0 03/16/2022    PLTCT 261 03/16/2022    WBC 13.0 03/16/2022    NEUT 85 03/16/2022    ANC 11.02 03/16/2022    ALC 1.07 03/16/2022    MONA 5 03/16/2022    AMC 0.70 03/16/2022    EOSA 1 03/16/2022    ABC 0.06 03/16/2022    MCV 88.3 03/16/2022    MCH 29.6 03/16/2022    MCHC 33.5 03/16/2022    MPV 7.6 03/16/2022    RDW 14.5 03/16/2022         General Chemistry:   Lab Results   Component Value Date    NA 135 03/15/2022    K 3.4 03/15/2022    CL 95 03/15/2022    CO2 32 03/15/2022    GAP 8 03/15/2022    BUN 13 03/15/2022    CR 0.67 03/15/2022    GLU 170 03/15/2022    CA 8.2 03/15/2022    ALBUMIN 2.8 03/15/2022    LACTIC 1.9 03/14/2022    TOTBILI 0.7 03/15/2022      Coagulation:   Lab Results   Component Value Date    PT 15.3 03/15/2022    INR 1.4 03/15/2022       PAC Plan    Anesthesia Plan    ASA score: 3   Plan: MAC  Induction method: intravenous      Informed Consent  Anesthetic plan and risks discussed with patient.        Plan discussed with: anesthesiologist, CRNA and surgeon/proceduralist.      Alerts

## 2022-03-16 NOTE — Progress Notes
03/16/22  1245 Pt instructed per pre, intra, and post TEE with pt acknowledging instructions and consent signed.  Earley Brooke, rN

## 2022-03-16 NOTE — Progress Notes
Report Sheet    TEE           Indication: Strep bacteremia  Ordering Provider: Darolyn Rua.    Name: Jon Pope     Age: 61 y.o.    DOB: 01/27/1961     MRN: [id number]    RM #: [id number]     RN: Andrez Grime    Phone #: [phone]    Isolation: None    Communication Barrier: None    NPO MN   A&O x4   Travel w/c   IV 0 gtts   O2 RA   Additional Information: N/A    Lab(s) needed: N/A  Other:   Device check needed: No    Device: No results found for: GENERATOR, EPDEVTYP    Prior TEE: N/A     Prior DCCV: N/A     Prior Echo:03/13/22   Previous TEE/DCCV details: N/A    EF:   ECHO EF   Date Value Ref Range Status   03/13/2022 65 % Final       Anticoag:N/A     Dose:N/A     Frequency:N/A     Last Dose:N/A     Missed:N/A    LAB:   Lab Results   Component Value Date/Time    INR 1.4 (H) 03/15/2022 07:35 AM    HGB 12.7 (L) 03/15/2022 07:35 AM    PLTCT 230 03/15/2022 07:35 AM    GLU 170 (H) 03/15/2022 07:35 AM    NA 135 (L) 03/15/2022 07:35 AM    K 3.4 (L) 03/15/2022 07:35 AM    CR 0.67 03/15/2022 07:35 AM       Probe   Gastric Surgery    GERD    Swallow difficulty    Head/Neck Surg/Rad    Chest Surg/Rad    EGD    Varices/Esophag CA    GI bleed    Dental issues      Sedation   COPD    OSA/CPAP    Asthma    Pulm HTN    Anesthesia Issues    Smoker    ETOH & Frequency    Drug Use    Chronic Pain Med      Current Medications:   No current outpatient medications on file.       Past Medical/Surgical History:   Patient Active Problem List    Diagnosis Date Noted   ? Streptococcal bacteremia 03/14/2022   ? Type 2 diabetes mellitus, with long-term current use of insulin (HCC) 03/14/2022   ? Primary hypertension 03/14/2022   ? HLD (hyperlipidemia) 03/14/2022   ? Gout 03/14/2022   ? Lymphedema due to venous insufficiency 03/14/2022   ? Chronic right shoulder pain 03/14/2022   ? Left lower lobe pneumonia 03/14/2022      Past Medical History:   Diagnosis Date   ? Diabetes (HCC)    ? HTN (hypertension)     No past surgical history on file.    Allergies: Allergies   Allergen Reactions   ? Sulfur ITCHING     Verner Chol, RN

## 2022-03-16 NOTE — Anesthesia Post-Procedure Evaluation
Post-Anesthesia Evaluation    Name: Jon Pope      MRN: [id number]     DOB: 08/22/1960     Age: 61 y.o.     Sex: male   __________________________________________________________________________     Procedure Information     Anesthesia Start Date/Time: 03/16/22 1244    Scheduled providers: Stephanie Acre, DO; Glynda Jaeger, MD; Pincus Badder, RN    Procedure: TRANSESOPHAGEAL ECHO    Location: Cardiology:[facility]          Post-Anesthesia Vitals  BP: 145/85 (08/21 1335)  Pulse: 68 (08/21 1335)  Respirations: 16 PER MINUTE (08/21 1335)  SpO2: 94 % (08/21 1335)  O2 Device: None (Room air) (08/21 1335)  Height: 172.7 cm (5\' 8" ) (08/21 1320)       Post Anesthesia Evaluation Note    Evaluation location: Pre/Post  Patient participation: recovered; patient participated in evaluation  Level of consciousness: alert    Pain score: 0  Pain management: adequate    Hydration: normovolemia  Temperature: 36.0C - 38.4C  Airway patency: adequate    Perioperative Events       Post-op nausea and vomiting: no PONV    Postoperative Status  Cardiovascular status: hemodynamically stable  Respiratory status: spontaneous ventilation  Follow-up needed: none        Perioperative Events  There were no known notable events for this encounter.

## 2022-03-16 NOTE — Progress Notes
Care Plan   Care Category & Patient Outcome Goal Met Treatment/  Interventions Plan of the Day RN Name   Cardiovascular  Hemodynamic stability and adequate peripheral perfusion.   Yes   Refer to  patient's chart. Monitor VS per sedation standard. Monitor ECG continuously.  Assess/maintain IV patency.   Harley Hallmark, RN   Respiratory  Patent airway, ease of respiration, and adequate oxygenation.   Yes   Refer to  patient's chart. Maintain open airway. Assess respirations. Monitor O2 saturations. Titrate O2 to keep sat>= 95% or baseline.   Harley Hallmark, RN   Psychological/Emotional/Spiritual  Cope with procedure with support in place.  Spiritual needs are addressed.     Yes   Refer to  patient's chart. Provide adequate and thorough instructions.  Provide a caring and supportive environment.  Communicate patients concerns with other members of the health team.   Harley Hallmark, RN   Pain  Patients pain goal met.   Yes   Refer to  patient's chart. Prepare patient for potentially uncomfortable procedure.  Observe for verbal/nonverbal complaints of pain.  Assess pain.  Provide comfort measures.   Harley Hallmark, RN   Safety/Fall Risk  Free from injury, security maintained.   Yes High Risk    Refer to  patient's chart.   Follow nursing standard of practice for high risks fall patients.   Harley Hallmark, RN   Knowledge Base  Verbalize understanding of procedure/information provided.   Yes   Refer to  patient's chart. Describe the procedure along with what symptoms to expect.  Evaluate patients understanding of procedure.  Encourage patient to ask questions.  Provide additional information as needed.   Harley Hallmark, RN

## 2022-03-17 ENCOUNTER — Encounter: Admit: 2022-03-17 | Discharge: 2022-03-17 | Payer: BC Managed Care – HMO

## 2022-03-17 ENCOUNTER — Inpatient Hospital Stay: Admit: 2022-03-17 | Discharge: 2022-03-17 | Payer: BC Managed Care – HMO

## 2022-03-17 DIAGNOSIS — E119 Type 2 diabetes mellitus without complications: Secondary | ICD-10-CM

## 2022-03-17 DIAGNOSIS — I10 Essential (primary) hypertension: Secondary | ICD-10-CM

## 2022-03-17 MED ORDER — PHENYLEPHRINE HCL IN 0.9% NACL 1 MG/10 ML (100 MCG/ML) IV SYRG
INTRAVENOUS | 0 refills | Status: DC
Start: 2022-03-17 — End: 2022-03-17
  Administered 2022-03-17 (×2): 100 ug via INTRAVENOUS

## 2022-03-17 MED ORDER — LIDOCAINE (PF) 20 MG/ML (2 %) IJ SOLN
INTRAVENOUS | 0 refills | Status: DC
Start: 2022-03-17 — End: 2022-03-17
  Administered 2022-03-17: 13:00:00 100 mg via INTRAVENOUS

## 2022-03-17 MED ORDER — ACETAMINOPHEN 1,000 MG/100 ML (10 MG/ML) IV SOLN
INTRAVENOUS | 0 refills | Status: DC
Start: 2022-03-17 — End: 2022-03-17
  Administered 2022-03-17: 14:00:00 1000 mg via INTRAVENOUS

## 2022-03-17 MED ORDER — MIDAZOLAM 1 MG/ML IJ SOLN
INTRAVENOUS | 0 refills | Status: DC
Start: 2022-03-17 — End: 2022-03-17
  Administered 2022-03-17: 13:00:00 2 mg via INTRAVENOUS

## 2022-03-17 MED ORDER — PHENYLEPHRINE 40 MCG/ML IN NS IV DRIP (STD CONC)
INTRAVENOUS | 0 refills | Status: DC
Start: 2022-03-17 — End: 2022-03-17
  Administered 2022-03-17 (×2): .2 ug/kg/min via INTRAVENOUS

## 2022-03-17 MED ORDER — ONDANSETRON HCL (PF) 4 MG/2 ML IJ SOLN
INTRAVENOUS | 0 refills | Status: DC
Start: 2022-03-17 — End: 2022-03-17
  Administered 2022-03-17: 14:00:00 4 mg via INTRAVENOUS

## 2022-03-17 MED ORDER — FENTANYL CITRATE (PF) 50 MCG/ML IJ SOLN
INTRAVENOUS | 0 refills | Status: DC
Start: 2022-03-17 — End: 2022-03-17
  Administered 2022-03-17: 13:00:00 50 ug via INTRAVENOUS

## 2022-03-17 MED ORDER — DEXAMETHASONE SODIUM PHOSPHATE 4 MG/ML IJ SOLN
INTRAVENOUS | 0 refills | Status: DC
Start: 2022-03-17 — End: 2022-03-17
  Administered 2022-03-17: 13:00:00 4 mg via INTRAVENOUS
  Administered 2022-03-17: 14:00:00 6 mg via INTRAVENOUS

## 2022-03-17 MED ORDER — ELECTROLYTE-A IV SOLP
INTRAVENOUS | 0 refills | Status: DC
Start: 2022-03-17 — End: 2022-03-17
  Administered 2022-03-17: 13:00:00 via INTRAVENOUS

## 2022-03-17 MED ORDER — PROPOFOL INJ 10 MG/ML IV VIAL
INTRAVENOUS | 0 refills | Status: DC
Start: 2022-03-17 — End: 2022-03-17
  Administered 2022-03-17: 13:00:00 100 mg via INTRAVENOUS

## 2022-03-17 MED ORDER — HYDROMORPHONE (PF) 2 MG/ML IJ SYRG
INTRAVENOUS | 0 refills | Status: DC
Start: 2022-03-17 — End: 2022-03-17
  Administered 2022-03-17: 14:00:00 .4 mg via INTRAVENOUS

## 2022-03-17 MED ORDER — ROCURONIUM 10 MG/ML IV SOLN
INTRAVENOUS | 0 refills | Status: DC
Start: 2022-03-17 — End: 2022-03-17
  Administered 2022-03-17: 13:00:00 100 mg via INTRAVENOUS

## 2022-03-17 MED ORDER — SUGAMMADEX 100 MG/ML IV SOLN
INTRAVENOUS | 0 refills | Status: DC
Start: 2022-03-17 — End: 2022-03-17
  Administered 2022-03-17: 14:00:00 500 mg via INTRAVENOUS

## 2022-03-17 MED ADMIN — OXYCODONE 10 MG PO TAB [166908]: 5 mg | ORAL | @ 16:00:00 | Stop: 2022-03-17 | NDC 68084096811

## 2022-03-17 MED ADMIN — CLINDAMYCIN IN 5 % DEXTROSE 900 MG/50 ML IV PGBK [9627]: 900 mg | INTRAVENOUS | @ 17:00:00 | NDC 43066099524

## 2022-03-17 MED ADMIN — SODIUM CHLORIDE 0.9 % IR SOLN [11403]: 1000 mL | @ 14:00:00 | Stop: 2022-03-17 | NDC 00338004804

## 2022-03-17 MED ADMIN — LACTATED RINGERS IV SOLP [4318]: INTRAVENOUS | @ 17:00:00 | Stop: 2022-03-17 | NDC 00338011704

## 2022-03-17 MED ADMIN — POTASSIUM CHLORIDE 20 MEQ PO TBTQ [35943]: 40 meq | ORAL | @ 19:00:00 | Stop: 2023-03-17 | NDC 00832532511

## 2022-03-17 MED ADMIN — METHOCARBAMOL 500 MG PO TAB [4971]: 750 mg | ORAL | @ 19:00:00 | NDC 00904705761

## 2022-03-17 MED ADMIN — CARVEDILOL 12.5 MG PO TAB [77424]: 12.5 mg | ORAL | @ 13:00:00 | Stop: 2022-03-17 | NDC 00904630261

## 2022-03-17 MED ADMIN — HYDROMORPHONE PCA 10 MG/50 ML SYR (STD CONC)(ADULT)(PREMADE) [212382]: 50.000 mL | INTRAVENOUS | @ 17:00:00 | NDC 70004030022

## 2022-03-17 MED ADMIN — CLINDAMYCIN IN 5 % DEXTROSE 900 MG/50 ML IV PGBK [9627]: 900 mg | INTRAVENOUS | @ 01:00:00 | NDC 43066099524

## 2022-03-17 MED ADMIN — FENTANYL CITRATE (PF) 50 MCG/ML IJ SOLN [3037]: 25 ug | INTRAVENOUS | @ 16:00:00 | Stop: 2022-03-17 | NDC 00409909412

## 2022-03-17 MED ADMIN — FUROSEMIDE 10 MG/ML IJ SOLN [3291]: 40 mg | INTRAVENOUS | @ 18:00:00 | Stop: 2022-03-17 | NDC 55150032301

## 2022-03-17 MED ADMIN — SODIUM CHLORIDE 0.9 % IR SOLN [11403]: 2000 mL | @ 15:00:00 | Stop: 2022-03-17 | NDC 00338004803

## 2022-03-17 MED ADMIN — HEPARIN, PORCINE (PF) 5,000 UNIT/0.5 ML IJ SYRG [95535]: 5000 [IU] | SUBCUTANEOUS | @ 13:00:00 | Stop: 2022-03-17 | NDC 63323011801

## 2022-03-17 MED ADMIN — FENTANYL CITRATE (PF) 50 MCG/ML IJ SOLN [3037]: 25 ug | INTRAVENOUS | @ 15:00:00 | Stop: 2022-03-17 | NDC 00409909412

## 2022-03-17 MED ADMIN — CLINDAMYCIN IN 5 % DEXTROSE 900 MG/50 ML IV PGBK [9627]: 900 mg | INTRAVENOUS | @ 09:00:00 | NDC 43066099524

## 2022-03-17 MED ADMIN — SODIUM CHLORIDE 0.9 % IV SOLP [27838]: 1000.000 mL | INTRAVENOUS | @ 12:00:00 | Stop: 2022-03-17 | NDC 00338004904

## 2022-03-17 MED ADMIN — VANCOMYCIN 1,000 MG IV SOLR [8442]: 2000 mL | @ 15:00:00 | Stop: 2022-03-17 | NDC 00409653311

## 2022-03-18 ENCOUNTER — Encounter: Admit: 2022-03-18 | Discharge: 2022-03-18 | Payer: BC Managed Care – HMO

## 2022-03-18 ENCOUNTER — Inpatient Hospital Stay: Admit: 2022-03-18 | Discharge: 2022-03-18 | Payer: BC Managed Care – HMO

## 2022-03-18 DIAGNOSIS — K0889 Other specified disorders of teeth and supporting structures: Secondary | ICD-10-CM

## 2022-03-18 DIAGNOSIS — R7881 Bacteremia: Secondary | ICD-10-CM

## 2022-03-18 DIAGNOSIS — K047 Periapical abscess without sinus: Secondary | ICD-10-CM

## 2022-03-18 MED ADMIN — POTASSIUM CHLORIDE 20 MEQ PO TBTQ [35943]: 20 meq | ORAL | @ 11:00:00 | Stop: 2023-03-17 | NDC 00832532511

## 2022-03-18 MED ADMIN — ACETAMINOPHEN 500 MG PO TAB [102]: 1000 mg | ORAL | @ 02:00:00 | Stop: 2022-03-22 | NDC 00904673061

## 2022-03-18 MED ADMIN — CLINDAMYCIN IN 5 % DEXTROSE 900 MG/50 ML IV PGBK [9627]: 900 mg | INTRAVENOUS | @ 17:00:00 | NDC 00338381450

## 2022-03-18 MED ADMIN — METHOCARBAMOL 500 MG PO TAB [4971]: 750 mg | ORAL | @ 11:00:00 | NDC 00904705761

## 2022-03-18 MED ADMIN — FUROSEMIDE 10 MG/ML IJ SOLN [3291]: 40 mg | INTRAVENOUS | @ 18:00:00 | Stop: 2022-03-18 | NDC 55150032301

## 2022-03-18 MED ADMIN — LACTATED RINGERS IV SOLP [4318]: 1000.000 mL | INTRAVENOUS | @ 09:00:00 | Stop: 2022-03-18 | NDC 00338011704

## 2022-03-18 MED ADMIN — CLINDAMYCIN IN 5 % DEXTROSE 900 MG/50 ML IV PGBK [9627]: 900 mg | INTRAVENOUS | @ 02:00:00 | NDC 43066099524

## 2022-03-18 MED ADMIN — PANTOPRAZOLE 40 MG PO TBEC [80436]: 40 mg | ORAL | @ 02:00:00 | NDC 00904647461

## 2022-03-18 MED ADMIN — METHOCARBAMOL 500 MG PO TAB [4971]: 750 mg | ORAL | @ 02:00:00 | NDC 00904705761

## 2022-03-18 MED ADMIN — CLINDAMYCIN IN 5 % DEXTROSE 900 MG/50 ML IV PGBK [9627]: 900 mg | INTRAVENOUS | @ 09:00:00 | NDC 43066099524

## 2022-03-18 MED ADMIN — METHOCARBAMOL 500 MG PO TAB [4971]: 750 mg | ORAL | @ 18:00:00 | NDC 00904705761

## 2022-03-19 ENCOUNTER — Inpatient Hospital Stay: Admit: 2022-03-19 | Discharge: 2022-03-19 | Payer: BC Managed Care – HMO

## 2022-03-19 ENCOUNTER — Encounter: Admit: 2022-03-19 | Discharge: 2022-03-19 | Payer: BC Managed Care – HMO

## 2022-03-19 DIAGNOSIS — E119 Type 2 diabetes mellitus without complications: Secondary | ICD-10-CM

## 2022-03-19 DIAGNOSIS — I10 Essential (primary) hypertension: Secondary | ICD-10-CM

## 2022-03-19 MED ORDER — LIDOCAINE (PF) 200 MG/10 ML (2 %) IJ SYRG
INTRAVENOUS | 0 refills | Status: DC
Start: 2022-03-19 — End: 2022-03-19
  Administered 2022-03-19: 13:00:00 100 mg via INTRAVENOUS

## 2022-03-19 MED ORDER — PROPOFOL INJ 10 MG/ML IV VIAL
INTRAVENOUS | 0 refills | Status: DC
Start: 2022-03-19 — End: 2022-03-19
  Administered 2022-03-19: 13:00:00 150 mg via INTRAVENOUS

## 2022-03-19 MED ORDER — SUCCINYLCHOLINE CHLORIDE 20 MG/ML IJ SOLN
INTRAVENOUS | 0 refills | Status: DC
Start: 2022-03-19 — End: 2022-03-19
  Administered 2022-03-19: 13:00:00 180 mg via INTRAVENOUS

## 2022-03-19 MED ORDER — MIDAZOLAM 1 MG/ML IJ SOLN
INTRAVENOUS | 0 refills | Status: DC
Start: 2022-03-19 — End: 2022-03-19
  Administered 2022-03-19 (×2): 1 mg via INTRAVENOUS

## 2022-03-19 MED ADMIN — CLINDAMYCIN IN 5 % DEXTROSE 900 MG/50 ML IV PGBK [9627]: 900 mg | INTRAVENOUS | @ 03:00:00 | NDC 43066099524

## 2022-03-19 MED ADMIN — ACETAMINOPHEN 500 MG PO TAB [102]: 1000 mg | ORAL | @ 20:00:00 | Stop: 2022-03-22 | NDC 00904673061

## 2022-03-19 MED ADMIN — ACETAMINOPHEN 500 MG PO TAB [102]: 1000 mg | ORAL | @ 02:00:00 | Stop: 2022-03-22 | NDC 00904673061

## 2022-03-19 MED ADMIN — FENTANYL CITRATE (PF) 50 MCG/ML IJ SOLN [3037]: 25 ug | INTRAVENOUS | @ 15:00:00 | Stop: 2022-03-19 | NDC 00409909412

## 2022-03-19 MED ADMIN — IPRATROPIUM-ALBUTEROL 0.5 MG-3 MG(2.5 MG BASE)/3 ML IN NEBU [77459]: 3 mL | RESPIRATORY_TRACT | @ 14:00:00 | Stop: 2022-03-19 | NDC 00487020101

## 2022-03-19 MED ADMIN — CLINDAMYCIN IN 5 % DEXTROSE 900 MG/50 ML IV PGBK [9627]: 900 mg | INTRAVENOUS | @ 10:00:00 | Stop: 2022-03-19 | NDC 00338381450

## 2022-03-19 MED ADMIN — POTASSIUM CHLORIDE 20 MEQ PO TBTQ [35943]: 40 meq | ORAL | @ 17:00:00 | Stop: 2022-03-19 | NDC 00832532511

## 2022-03-19 MED ADMIN — FENTANYL CITRATE (PF) 50 MCG/ML IJ SOLN [3037]: 25 ug | INTRAVENOUS | @ 14:00:00 | Stop: 2022-03-19 | NDC 00409909412

## 2022-03-19 MED ADMIN — METHOCARBAMOL 500 MG PO TAB [4971]: 750 mg | ORAL | @ 03:00:00 | NDC 00904705761

## 2022-03-19 MED ADMIN — HYDROMORPHONE (PF) 2 MG/ML IJ SYRG [163476]: 1 mg | INTRAVENOUS | @ 13:00:00 | NDC 76045001010

## 2022-03-19 MED ADMIN — PANTOPRAZOLE 40 MG PO TBEC [80436]: 40 mg | ORAL | @ 02:00:00 | NDC 00904647461

## 2022-03-19 MED ADMIN — LACTATED RINGERS IV SOLP [4318]: 1000 mL | INTRAVENOUS | @ 11:00:00 | Stop: 2022-03-19 | NDC 00338011704

## 2022-03-19 MED ADMIN — CLINDAMYCIN IN 5 % DEXTROSE 900 MG/50 ML IV PGBK [9627]: 900 mg | INTRAVENOUS | @ 17:00:00 | Stop: 2022-03-19 | NDC 43066099524

## 2022-03-19 MED ADMIN — OXYCODONE 5 MG PO TAB [10814]: 5 mg | ORAL | @ 14:00:00 | Stop: 2022-03-19 | NDC 00406055223

## 2022-03-19 MED ADMIN — METHOCARBAMOL 500 MG PO TAB [4971]: 750 mg | ORAL | @ 20:00:00 | NDC 00904705761

## 2022-03-19 NOTE — Anesthesia Pre-Procedure Evaluation
Anesthesia Pre-Procedure Evaluation    Name: Jon Pope      MRN: [id number]     DOB: 26-Aug-1960     Age: 61 y.o.     Sex: male   _________________________________________________________________________     Procedure Info:   Procedure Information     Date/Time: 03/19/22 0730    Procedure: EXPLORATION POSTOPERATIVE WOUND - NECK (Right: Neck) - 30 mins    Location: [facility] OR02 / [facility] OR/Periop    Surgeons: Audrea Muscat, MD          Physical Assessment  Vital Signs (last filed in past 24 hours):  BP: 120/86 (08/24 0607)  Temp: 36.6 ?C (97.9 ?F) (08/24 1914)  Pulse: 60 (08/24 0607)  Respirations: 20 PER MINUTE (08/24 0607)  SpO2: 92 % (08/24 0607)  O2 Device: Nasal cannula (08/24 0607)  O2 Liter Flow: 1 Lpm (08/24 0607)  SpO2 Pulse: 60 (08/24 7829)      Patient History   Allergies   Allergen Reactions   ? Sulfur ITCHING        Current Medications    Not on File       Review of Systems/Medical History      Patient summary reviewed  Nursing notes reviewed  Pertinent labs reviewed    PONV Screening: Non-smoker  No history of anesthetic complications  No family history of anesthetic complications        Pulmonary          No indications/hx of asthma    no COPD      Pneumonia ( -[facility] 2 view chest x-ray (03/08/2022) with questionable opacity in the left lower lobe and right subhilar space)        Obstructive Sleep Apnea          Interventions: CPAP; noncompliant      Cardiovascular       Recent diagnostic studies:          echocardiogram      Exercise tolerance: <4 METS      Hypertension,             No PTCA      PVD (Lymphedema due to venous insufficiency)      Hyperlipidemia        group G bacteremia and R neck cellulitis with concern of mediastinitis      GI/Hepatic/Renal           No GERD,       Liver disease (Transaminitis on admission):       No renal disease:          Neuro/Psych       No seizures      No hx neuromuscular disease      No hx TIA      No CVA      No indications/hx of sensory deficit      Pt had head CT this morning for mental status change, currently A&Ox4      Musculoskeletal         No neck pain      No back pain      Chronic right shoulder pain        Endocrine/Other       Diabetes, type 2; using insulin      Most recent Hgb A1C:7.2 - 9        No hypothyroidism      No hyperthyroidism      No blood dyscrasia  Obesity: Class 3 (BMI 40-49.9)        Necrotizing fasciitis (neck)   Physical Exam    Airway Findings      Mallampati: III      TM distance: >3 FB      Neck ROM: full      Mouth opening: good    Dental Findings: Negative      Cardiovascular Findings:       Rhythm: regular      Rate: normal    Pulmonary Findings:       Breath sounds clear to auscultation.    Abdominal Findings:       Obese    Neurological Findings:       Alert and oriented x 3    Constitutional findings:       No acute distress       Diagnostic Tests  Hematology:   Lab Results   Component Value Date    HGB 13.1 03/19/2022    HCT 37.8 03/19/2022    PLTCT 284 03/19/2022    WBC 9.0 03/19/2022    NEUT 73 03/19/2022    ANC 6.65 03/19/2022    ALC 1.60 03/19/2022    MONA 6 03/19/2022    AMC 0.58 03/19/2022    EOSA 2 03/19/2022    ABC 0.04 03/19/2022    MCV 88.3 03/19/2022    MCH 30.5 03/19/2022    MCHC 34.6 03/19/2022    MPV 7.3 03/19/2022    RDW 14.3 03/19/2022         General Chemistry:   Lab Results   Component Value Date    NA 139 03/19/2022    K 3.4 03/19/2022    CL 99 03/19/2022    CO2 29 03/19/2022    GAP 11 03/19/2022    BUN 20 03/19/2022    CR 0.86 03/19/2022    GLU 176 03/19/2022    CA 8.6 03/19/2022    ALBUMIN 2.9 03/19/2022    LACTIC 1.9 03/14/2022    MG 2.2 03/19/2022    TOTBILI 0.4 03/19/2022    PO4 2.8 03/19/2022      Coagulation:   Lab Results   Component Value Date    PT 15.3 03/15/2022    INR 1.4 03/15/2022     Echo 03/13/2022  LVEF 65-70%. No evidence of valvular abnormalities on spectral doppler. Consider TEE for improved sensitivity for detection of valvular vegetations if clinically indicated. No pericardial effusion    TEE 03/16/2022  ?  No echocardiographic evidence of endocarditis.  ?  No valvular vegetations or significant valvular regurgitation noted.  ?  Normal biventricular systolic function with an estimated left ventricular ejection fraction of 60%.  ?  No pericardial effusion.    ?  PAC Plan      Anesthesia Plan    ASA score: 3   Plan: general  Special equipment/procedures: Art line  Induction method: intravenous  NPO status: acceptable      Informed Consent  Anesthetic plan and risks discussed with patient.  Use of blood products discussed with patient  Blood Consent: consented      Plan discussed with: anesthesiologist and CRNA.      Alerts: No

## 2022-03-19 NOTE — Anesthesia Post-Procedure Evaluation
Post-Anesthesia Evaluation    Name: Jon Pope      MRN: [id number]     DOB: [birth year]     Age: 61 y.o.     Sex: male   __________________________________________________________________________     Procedure Information     Anesthesia Start Date/Time: 03/19/22 0734    Procedure: EXPLORATION POSTOPERATIVE WOUND - NECK (Right: Neck) - 30 mins    Location: [facility] OR02 / [facility] OR/Periop    Surgeons: Audrea Muscat, MD          Post-Anesthesia Vitals  BP: 122/80 (08/24 1010)  Temp: 36.6 C (97.8 F) (08/24 1010)  Pulse: 55 (08/24 1010)  Respirations: 16 PER MINUTE (08/24 1010)  SpO2: 94 % (08/24 1010)  SpO2 Pulse: 62 (08/24 0945)  O2 Device: Nasal cannula (08/24 1010)   Vitals Value Taken Time   BP 118/80 03/19/22 0945   Temp     Pulse 61 03/19/22 0945   Respirations 17 PER MINUTE 03/19/22 0945   SpO2 91 % 03/19/22 0945   O2 Device Nasal cannula 03/19/22 0945   ABP     ART BP           Post Anesthesia Evaluation Note    Evaluation location: Pre/Post  Patient participation: recovered; patient participated in evaluation  Level of consciousness: alert  Pain management: adequate    Hydration: normovolemia  Temperature: 36.0C - 38.4C  Airway patency: adequate    Perioperative Events       Post-op nausea and vomiting: no PONV    Postoperative Status  Cardiovascular status: hemodynamically stable  Respiratory status: spontaneous ventilation and supplemental oxygen (Duoneb treatment and incentive spirometer for low SpO2 likely 2/2 atelectasis)  Follow-up needed: none  Additional comments: Post-Anesthesia Evaluation Attestation: I reviewed and agree the indicated post-anesthesia care was provided. I have reviewed key portions of the indicated post anesthesia care. I have examined the patient's vitals, physical status, and complications and agree with what is documented.    Staff name:  Lorel Monaco, MD Date:  03/19/2022            Perioperative Events  There were no known notable events for this encounter.

## 2022-03-20 ENCOUNTER — Inpatient Hospital Stay: Admit: 2022-03-20 | Discharge: 2022-03-20 | Payer: BC Managed Care – HMO

## 2022-03-20 ENCOUNTER — Encounter: Admit: 2022-03-20 | Discharge: 2022-03-20 | Payer: BC Managed Care – HMO

## 2022-03-20 DIAGNOSIS — I10 Essential (primary) hypertension: Secondary | ICD-10-CM

## 2022-03-20 DIAGNOSIS — E119 Type 2 diabetes mellitus without complications: Secondary | ICD-10-CM

## 2022-03-20 MED ORDER — ROCURONIUM 10 MG/ML IV SOLN
INTRAVENOUS | 0 refills | Status: DC
Start: 2022-03-20 — End: 2022-03-20
  Administered 2022-03-20: 17:00:00 50 mg via INTRAVENOUS

## 2022-03-20 MED ORDER — PHENYLEPHRINE HCL IN 0.9% NACL 1 MG/10 ML (100 MCG/ML) IV SYRG
INTRAVENOUS | 0 refills | Status: DC
Start: 2022-03-20 — End: 2022-03-20
  Administered 2022-03-20 (×2): 100 ug via INTRAVENOUS

## 2022-03-20 MED ORDER — LIDOCAINE (PF) 200 MG/10 ML (2 %) IJ SYRG
INTRAVENOUS | 0 refills | Status: DC
Start: 2022-03-20 — End: 2022-03-20
  Administered 2022-03-20: 17:00:00 100 mg via INTRAVENOUS

## 2022-03-20 MED ORDER — FENTANYL CITRATE (PF) 50 MCG/ML IJ SOLN
INTRAVENOUS | 0 refills | Status: DC
Start: 2022-03-20 — End: 2022-03-20
  Administered 2022-03-20 (×2): 50 ug via INTRAVENOUS

## 2022-03-20 MED ORDER — CEFAZOLIN 1 GRAM IJ SOLR
INTRAVENOUS | 0 refills | Status: DC
Start: 2022-03-20 — End: 2022-03-20
  Administered 2022-03-20: 17:00:00 3 g via INTRAVENOUS

## 2022-03-20 MED ORDER — ARTIFICIAL TEARS (PF) SINGLE DOSE DROPS GROUP
OPHTHALMIC | 0 refills | Status: DC
Start: 2022-03-20 — End: 2022-03-20
  Administered 2022-03-20: 17:00:00 2 [drp] via OPHTHALMIC

## 2022-03-20 MED ORDER — SUGAMMADEX 100 MG/ML IV SOLN
INTRAVENOUS | 0 refills | Status: DC
Start: 2022-03-20 — End: 2022-03-20
  Administered 2022-03-20: 18:00:00 250 mg via INTRAVENOUS

## 2022-03-20 MED ORDER — MIDAZOLAM 1 MG/ML IJ SOLN
INTRAVENOUS | 0 refills | Status: DC
Start: 2022-03-20 — End: 2022-03-20
  Administered 2022-03-20: 17:00:00 2 mg via INTRAVENOUS

## 2022-03-20 MED ORDER — PROPOFOL INJ 10 MG/ML IV VIAL
INTRAVENOUS | 0 refills | Status: DC
Start: 2022-03-20 — End: 2022-03-20
  Administered 2022-03-20: 17:00:00 200 mg via INTRAVENOUS

## 2022-03-20 MED ORDER — ONDANSETRON HCL (PF) 4 MG/2 ML IJ SOLN
INTRAVENOUS | 0 refills | Status: DC
Start: 2022-03-20 — End: 2022-03-20
  Administered 2022-03-20: 18:00:00 4 mg via INTRAVENOUS

## 2022-03-20 MED ORDER — DEXAMETHASONE SODIUM PHOSPHATE 4 MG/ML IJ SOLN
INTRAVENOUS | 0 refills | Status: DC
Start: 2022-03-20 — End: 2022-03-20
  Administered 2022-03-20: 17:00:00 4 mg via INTRAVENOUS

## 2022-03-20 MED ADMIN — FENTANYL CITRATE (PF) 50 MCG/ML IJ SOLN [3037]: 50 ug | INTRAVENOUS | @ 20:00:00 | Stop: 2022-03-20 | NDC 00641602701

## 2022-03-20 MED ADMIN — ARTICAINE-EPINEPHRINE BITART 4 %- 1:200,000 IJ CRTG [322740]: 3.7 mL | INTRAMUSCULAR | @ 18:00:00 | Stop: 2022-03-20 | NDC 00404662505

## 2022-03-20 MED ADMIN — OXYCODONE 5 MG PO TAB [10814]: 5 mg | ORAL | @ 21:00:00 | NDC 00406055223

## 2022-03-20 MED ADMIN — METHOCARBAMOL 500 MG PO TAB [4971]: 750 mg | ORAL | @ 04:00:00 | NDC 00904705761

## 2022-03-20 MED ADMIN — ACETAMINOPHEN 500 MG PO TAB [102]: 1000 mg | ORAL | @ 14:00:00 | Stop: 2022-03-22 | NDC 00904673061

## 2022-03-20 MED ADMIN — PANTOPRAZOLE 40 MG PO TBEC [80436]: 40 mg | ORAL | @ 01:00:00 | NDC 00904647461

## 2022-03-20 MED ADMIN — ACETAMINOPHEN 500 MG PO TAB [102]: 1000 mg | ORAL | @ 21:00:00 | Stop: 2022-03-22 | NDC 00904673061

## 2022-03-20 MED ADMIN — AMPICILLIN-SULBACTAM 3 GRAM IJ SOLR [32471]: 3 g | INTRAVENOUS | @ 22:00:00 | NDC 55150011720

## 2022-03-20 MED ADMIN — SODIUM CHLORIDE 0.9 % IV PGBK (MB+) [95161]: 3 g | INTRAVENOUS | @ 22:00:00 | NDC 00338915930

## 2022-03-20 MED ADMIN — LIDOCAINE-EPINEPHRINE BIT 2 %-1:100,000 IJ CRTG [168873]: 3.7 mL | INTRAMUSCULAR | @ 18:00:00 | Stop: 2022-03-20 | NDC 00404651205

## 2022-03-20 MED ADMIN — METHOCARBAMOL 500 MG PO TAB [4971]: 750 mg | ORAL | @ 11:00:00 | NDC 00904705761

## 2022-03-20 MED ADMIN — LACTATED RINGERS IV SOLP [4318]: 1000 mL | INTRAVENOUS | @ 15:00:00 | Stop: 2022-03-22 | NDC 00338011704

## 2022-03-20 MED ADMIN — FUROSEMIDE 10 MG/ML IJ SOLN [3291]: 40 mg | INTRAVENOUS | @ 21:00:00 | Stop: 2022-03-20 | NDC 55150032301

## 2022-03-20 MED ADMIN — HYDROMORPHONE (PF) 2 MG/ML IJ SYRG [163476]: 0.5 mg | INTRAVENOUS | @ 22:00:00 | NDC 00409131203

## 2022-03-20 MED ADMIN — ACETAMINOPHEN 500 MG PO TAB [102]: 1000 mg | ORAL | @ 02:00:00 | Stop: 2022-03-22 | NDC 00904673061

## 2022-03-20 MED ADMIN — FENTANYL CITRATE (PF) 50 MCG/ML IJ SOLN [3037]: 50 ug | INTRAVENOUS | @ 19:00:00 | Stop: 2022-03-20 | NDC 00641602701

## 2022-03-21 ENCOUNTER — Encounter: Admit: 2022-03-21 | Discharge: 2022-03-21 | Payer: BC Managed Care – HMO

## 2022-03-21 ENCOUNTER — Inpatient Hospital Stay: Admit: 2022-03-21 | Discharge: 2022-03-21 | Payer: BC Managed Care – HMO

## 2022-03-21 DIAGNOSIS — I10 Essential (primary) hypertension: Secondary | ICD-10-CM

## 2022-03-21 DIAGNOSIS — E119 Type 2 diabetes mellitus without complications: Secondary | ICD-10-CM

## 2022-03-21 MED ADMIN — SODIUM CHLORIDE 0.9 % IV PGBK (MB+) [95161]: 3 g | INTRAVENOUS | @ 15:00:00 | NDC 00338915930

## 2022-03-21 MED ADMIN — ACETAMINOPHEN 500 MG PO TAB [102]: 1000 mg | ORAL | @ 23:00:00 | NDC 00904673061

## 2022-03-21 MED ADMIN — AMPICILLIN-SULBACTAM 3 GRAM IJ SOLR [32471]: 3 g | INTRAVENOUS | @ 10:00:00 | NDC 55150011720

## 2022-03-21 MED ADMIN — AMPICILLIN-SULBACTAM 3 GRAM IJ SOLR [32471]: 3 g | INTRAVENOUS | @ 23:00:00 | NDC 55150011720

## 2022-03-21 MED ADMIN — AMPICILLIN-SULBACTAM 3 GRAM IJ SOLR [32471]: 3 g | INTRAVENOUS | @ 15:00:00 | NDC 55150011720

## 2022-03-21 MED ADMIN — SODIUM CHLORIDE 0.9 % IV PGBK (MB+) [95161]: 3 g | INTRAVENOUS | @ 10:00:00 | NDC 00338915930

## 2022-03-21 MED ADMIN — SODIUM CHLORIDE 0.9 % IV PGBK (MB+) [95161]: 3 g | INTRAVENOUS | @ 23:00:00 | NDC 00338915930

## 2022-03-21 MED ADMIN — POTASSIUM CHLORIDE 20 MEQ PO TBTQ [35943]: 40 meq | ORAL | @ 10:00:00 | Stop: 2022-03-21 | NDC 00832532511

## 2022-03-21 MED ADMIN — METHOCARBAMOL 500 MG PO TAB [4971]: 750 mg | ORAL | @ 10:00:00 | Stop: 2022-03-21 | NDC 00904705761

## 2022-03-21 MED ADMIN — SODIUM CHLORIDE 0.9 % IV PGBK (MB+) [95161]: 3 g | INTRAVENOUS | @ 04:00:00 | NDC 00338915930

## 2022-03-21 MED ADMIN — ACETAMINOPHEN 160 MG/5 ML PO SOLN [100]: 1000 mg | NASOGASTRIC | @ 02:00:00 | Stop: 2022-03-22 | NDC 81033000210

## 2022-03-21 MED ADMIN — OXYCODONE 5 MG PO TAB [10814]: 5 mg | ORAL | @ 09:00:00 | NDC 00406055223

## 2022-03-21 MED ADMIN — AMPICILLIN-SULBACTAM 3 GRAM IJ SOLR [32471]: 3 g | INTRAVENOUS | @ 04:00:00 | NDC 55150011720

## 2022-03-21 MED ADMIN — HYDROMORPHONE (PF) 2 MG/ML IJ SYRG [163476]: 0.5 mg | INTRAVENOUS | @ 10:00:00 | NDC 00409131203

## 2022-03-21 MED ADMIN — PANTOPRAZOLE 40 MG PO TBEC [80436]: 40 mg | ORAL | @ 02:00:00 | NDC 00904647461

## 2022-03-21 MED ADMIN — METHOCARBAMOL 500 MG PO TAB [4971]: 750 mg | ORAL | @ 02:00:00 | NDC 00904705761

## 2022-03-22 ENCOUNTER — Inpatient Hospital Stay: Admit: 2022-03-22 | Discharge: 2022-03-22 | Payer: BC Managed Care – HMO

## 2022-03-22 ENCOUNTER — Encounter: Admit: 2022-03-22 | Discharge: 2022-03-22 | Payer: BC Managed Care – HMO

## 2022-03-22 DIAGNOSIS — E119 Type 2 diabetes mellitus without complications: Secondary | ICD-10-CM

## 2022-03-22 DIAGNOSIS — I10 Essential (primary) hypertension: Secondary | ICD-10-CM

## 2022-03-22 MED ADMIN — SODIUM CHLORIDE 0.9 % IV PGBK (MB+) [95161]: 3 g | INTRAVENOUS | @ 16:00:00 | NDC 00338915930

## 2022-03-22 MED ADMIN — SODIUM CHLORIDE 0.9 % IV PGBK (MB+) [95161]: 3 g | INTRAVENOUS | @ 10:00:00 | NDC 00338915930

## 2022-03-22 MED ADMIN — PANTOPRAZOLE 40 MG PO TBEC [80436]: 40 mg | ORAL | @ 02:00:00 | NDC 00904647461

## 2022-03-22 MED ADMIN — SENNOSIDES-DOCUSATE SODIUM 8.6-50 MG PO TAB [40926]: 2 | ORAL | @ 14:00:00 | NDC 00536124810

## 2022-03-22 MED ADMIN — SODIUM CHLORIDE 0.9 % IV PGBK (MB+) [95161]: 3 g | INTRAVENOUS | @ 04:00:00 | NDC 00338915930

## 2022-03-22 MED ADMIN — AMPICILLIN-SULBACTAM 3 GRAM IJ SOLR [32471]: 3 g | INTRAVENOUS | @ 16:00:00 | NDC 55150011720

## 2022-03-22 MED ADMIN — POTASSIUM CHLORIDE 20 MEQ PO TBTQ [35943]: 40 meq | ORAL | @ 14:00:00 | Stop: 2022-03-22 | NDC 00832532511

## 2022-03-22 MED ADMIN — ACETAMINOPHEN 500 MG PO TAB [102]: 1000 mg | ORAL | @ 04:00:00 | NDC 00904673061

## 2022-03-22 MED ADMIN — AMPICILLIN-SULBACTAM 3 GRAM IJ SOLR [32471]: 3 g | INTRAVENOUS | @ 04:00:00 | NDC 55150011720

## 2022-03-22 MED ADMIN — AMPICILLIN-SULBACTAM 3 GRAM IJ SOLR [32471]: 3 g | INTRAVENOUS | @ 10:00:00 | NDC 55150011720

## 2022-03-22 MED ADMIN — SODIUM CHLORIDE 0.9 % IV PGBK (MB+) [95161]: 3 g | INTRAVENOUS | @ 22:00:00 | NDC 00338915930

## 2022-03-22 MED ADMIN — AMPICILLIN-SULBACTAM 3 GRAM IJ SOLR [32471]: 3 g | INTRAVENOUS | @ 22:00:00 | NDC 55150011720

## 2022-03-23 ENCOUNTER — Inpatient Hospital Stay: Admit: 2022-03-23 | Discharge: 2022-03-23 | Payer: BC Managed Care – HMO

## 2022-03-23 MED ADMIN — ACETAMINOPHEN 500 MG PO TAB [102]: 1000 mg | ORAL | @ 02:00:00 | NDC 00904673061

## 2022-03-23 MED ADMIN — ENOXAPARIN 40 MG/0.4 ML SC SYRG [85052]: 40 mg | SUBCUTANEOUS | @ 02:00:00 | NDC 00781324602

## 2022-03-23 MED ADMIN — POTASSIUM CHLORIDE 20 MEQ PO TBTQ [35943]: 60 meq | ORAL | @ 14:00:00 | Stop: 2022-03-23 | NDC 00832532511

## 2022-03-23 MED ADMIN — AMPICILLIN-SULBACTAM 3 GRAM IJ SOLR [32471]: 3 g | INTRAVENOUS | @ 22:00:00 | NDC 55150011720

## 2022-03-23 MED ADMIN — SODIUM CHLORIDE 0.9 % IV PGBK (MB+) [95161]: 3 g | INTRAVENOUS | @ 04:00:00 | NDC 00338915930

## 2022-03-23 MED ADMIN — ACETAMINOPHEN 500 MG PO TAB [102]: 1000 mg | ORAL | @ 18:00:00 | NDC 00904673061

## 2022-03-23 MED ADMIN — SODIUM CHLORIDE 0.9 % IV PGBK (MB+) [95161]: 3 g | INTRAVENOUS | @ 10:00:00 | NDC 00338915930

## 2022-03-23 MED ADMIN — PANTOPRAZOLE 40 MG PO TBEC [80436]: 40 mg | ORAL | @ 02:00:00 | NDC 00904647461

## 2022-03-23 MED ADMIN — SODIUM CHLORIDE 0.9 % IV PGBK (MB+) [95161]: 3 g | INTRAVENOUS | @ 16:00:00 | NDC 00338915930

## 2022-03-23 MED ADMIN — AMPICILLIN-SULBACTAM 3 GRAM IJ SOLR [32471]: 3 g | INTRAVENOUS | @ 04:00:00 | NDC 55150011720

## 2022-03-23 MED ADMIN — AMPICILLIN-SULBACTAM 3 GRAM IJ SOLR [32471]: 3 g | INTRAVENOUS | @ 10:00:00 | NDC 55150011720

## 2022-03-23 MED ADMIN — CARVEDILOL 6.25 MG PO TAB [77309]: 6.25 mg | ORAL | @ 18:00:00 | NDC 00904630161

## 2022-03-23 MED ADMIN — SODIUM CHLORIDE 0.9 % IV PGBK (MB+) [95161]: 3 g | INTRAVENOUS | @ 22:00:00 | NDC 00338915930

## 2022-03-23 MED ADMIN — AMPICILLIN-SULBACTAM 3 GRAM IJ SOLR [32471]: 3 g | INTRAVENOUS | @ 16:00:00 | NDC 55150011720

## 2022-03-23 MED ADMIN — OXYCODONE 5 MG PO TAB [10814]: 5 mg | ORAL | @ 18:00:00 | NDC 00406055223

## 2022-03-24 ENCOUNTER — Inpatient Hospital Stay: Admit: 2022-03-24 | Discharge: 2022-03-24 | Payer: BC Managed Care – HMO

## 2022-03-24 ENCOUNTER — Encounter: Admit: 2022-03-24 | Discharge: 2022-03-24 | Payer: BC Managed Care – HMO

## 2022-03-24 MED ADMIN — SODIUM CHLORIDE 0.9 % IV PGBK (MB+) [95161]: 3 g | INTRAVENOUS | @ 03:00:00 | NDC 00338915930

## 2022-03-24 MED ADMIN — POTASSIUM CHLORIDE 20 MEQ PO TBTQ [35943]: 60 meq | ORAL | @ 14:00:00 | Stop: 2022-03-24 | NDC 00832532511

## 2022-03-24 MED ADMIN — PANTOPRAZOLE 40 MG PO TBEC [80436]: 40 mg | ORAL | @ 02:00:00 | NDC 00904647461

## 2022-03-24 MED ADMIN — AMPICILLIN-SULBACTAM 3 GRAM IJ SOLR [32471]: 3 g | INTRAVENOUS | @ 03:00:00 | NDC 55150011720

## 2022-03-24 MED ADMIN — ENOXAPARIN 40 MG/0.4 ML SC SYRG [85052]: 40 mg | SUBCUTANEOUS | @ 02:00:00 | NDC 00781324602

## 2022-03-24 MED ADMIN — CARVEDILOL 6.25 MG PO TAB [77309]: 6.25 mg | ORAL | @ 14:00:00 | Stop: 2022-03-24 | NDC 00904630161

## 2022-03-24 MED ADMIN — AMPICILLIN-SULBACTAM 3 GRAM IJ SOLR [32471]: 3 g | INTRAVENOUS | @ 09:00:00 | Stop: 2022-03-24 | NDC 55150011720

## 2022-03-24 MED ADMIN — OXYCODONE 5 MG PO TAB [10814]: 5 mg | ORAL | @ 02:00:00 | NDC 00406055223

## 2022-03-24 MED ADMIN — SODIUM CHLORIDE 0.9 % IV PGBK (MB+) [95161]: 3 g | INTRAVENOUS | @ 16:00:00 | Stop: 2022-03-24 | NDC 00338915930

## 2022-03-24 MED ADMIN — SODIUM CHLORIDE 0.9 % IV PGBK (MB+) [95161]: 3 g | INTRAVENOUS | @ 09:00:00 | Stop: 2022-03-24 | NDC 00338915930

## 2022-03-24 MED ADMIN — ACETAMINOPHEN 500 MG PO TAB [102]: 1000 mg | ORAL | @ 19:00:00 | Stop: 2022-03-24 | NDC 00904673061

## 2022-03-24 MED ADMIN — AMPICILLIN-SULBACTAM 3 GRAM IJ SOLR [32471]: 3 g | INTRAVENOUS | @ 16:00:00 | Stop: 2022-03-24 | NDC 55150011720

## 2022-03-24 MED ADMIN — CARVEDILOL 6.25 MG PO TAB [77309]: 6.25 mg | ORAL | @ 02:00:00 | NDC 00904630161

## 2022-03-24 MED FILL — TORSEMIDE 20 MG PO TAB: 20 mg | ORAL | 8 days supply | Qty: 30 | Fill #1 | Status: CP

## 2022-03-24 MED FILL — GLUCOCARD EXPRESSION MISC STRP: 34 days supply | Qty: 100 | Fill #1 | Status: CP

## 2022-03-24 MED FILL — INSULIN ASPART 100 UNIT/ML SC FLEXPEN: 100 unit/mL (3 mL) | SUBCUTANEOUS | 42 days supply | Qty: 15 | Fill #1 | Status: CP

## 2022-03-24 MED FILL — PEN NEEDLE, DIABETIC 32 GAUGE X 5/32" MISC NDLE: 32 gauge x 5/" | 20 days supply | Qty: 100 | Fill #1 | Status: CP

## 2022-03-24 MED FILL — CARVEDILOL 6.25 MG PO TAB: 6.25 mg | ORAL | 30 days supply | Qty: 60 | Fill #1 | Status: CP

## 2022-03-24 MED FILL — AMLODIPINE 5 MG PO TAB: 5 mg | ORAL | 30 days supply | Qty: 30 | Fill #1 | Status: CP

## 2022-03-24 MED FILL — LANCETS MISC MISC: 34 days supply | Qty: 100 | Fill #1 | Status: CP

## 2022-03-24 MED FILL — BLOOD-GLUCOSE METER MISC MISC: 30 days supply | Qty: 1 | Fill #1 | Status: CP

## 2022-03-24 MED FILL — OXYCODONE 5 MG PO TAB: 5 mg | ORAL | 5 days supply | Qty: 15 | Fill #1 | Status: CP

## 2022-03-24 MED FILL — AMOXICILLIN-POT CLAVULANATE 875-125 MG PO TAB: 875/125 mg | ORAL | 9 days supply | Qty: 18 | Fill #1 | Status: CP

## 2022-03-24 MED FILL — PANTOPRAZOLE 40 MG PO TBEC: 40 mg | ORAL | 30 days supply | Qty: 30 | Fill #1 | Status: CP

## 2022-03-24 MED FILL — ALLOPURINOL 300 MG PO TAB: 300 mg | ORAL | 30 days supply | Qty: 30 | Fill #1 | Status: CP

## 2022-03-24 MED FILL — POTASSIUM CHLORIDE 20 MEQ PO TBTQ: 20 mEq | ORAL | 45 days supply | Qty: 90 | Fill #1 | Status: CP

## 2022-03-24 MED FILL — INSULIN DETEMIR 100 UNIT/ML SC FLEXTOUCH: 100 unit/mL (3 mL) | SUBCUTANEOUS | 75 days supply | Qty: 45 | Fill #1 | Status: CP

## 2022-03-27 ENCOUNTER — Encounter: Admit: 2022-03-27 | Discharge: 2022-03-27 | Payer: BC Managed Care – HMO

## 2022-03-27 NOTE — Telephone Encounter
Received call from Jola Babinski [phone]), home health nurse with [facility] [phone]).  Requesting clarification on wound vac orders for week of 9/4.  Confirmed with ID RN that [facility] will not take down wound vac at clinic appointment on 9/5.  Requested that pictures be emailed to clinic.  Confirmed that [name] will follow for wound vac orders.  Nurse requesting to add collagen to wound bed.  Will continue current plan. Beatriz Stallion, RN

## 2022-03-30 NOTE — Progress Notes
History of Present Illness:     60 y.o. with history of IDDM, HLD who presents for follow up of Streptococcal bacteremia secondary to necrotizing skin/soft tissue infection of the neck.       Pertinent Hx:  Pt initially was admitted to [facility] ([place], [place]) on 03/09/11 with complaints of neck pain. Initial CT neck (03/09/22) showed mild skin thickening with inflammatory stranding and muscle edema of R side of neck. His blood cx from admission grew Group C Streptococcus with repeat remaining positive on 03/10/22. He had continued increase in WBC prompting  MRI C spine (03/13/22) which showed infection w/i the R carotid space and R side of the neck with extension into preverterbal soft tissuee and mediastinum. CT chest (03/13/22) showed phlegmon involving R side of the neck encasing manubrium and extending into anterior mediastinum and R lung apex. He was subsequently transferred to [facility] on 03/14/22. At [facility] repeat CT chest and neck (03/16/22) showed progression of infection with development of sternoclavicular abscess. He was taken to OR on 8/22 for debridement (ENT and CTS), purulence extended ~7 cm into prepleural space, wound vac was placed. He was taken back to OR on 8/24 and no additional purulence or necrotic tissue noted. S/p extraction of multiple teeth (8/25). He received IV antibiotics while hospitalized and was transitioned to Augmentin on discharge.     Today's visit (03/31/22)  States he is doing well. His wound vac is being changed 3 times a week and his brother notes it has been getting smaller. He has been taking his antibiotics without difficulty. He notes his stools are still looser but is only having them 1-2 times per day. He denies rash. He denies fevers or chills. He denies rash. He notes he is having some pain when he moves his neck upward but otherwise is not having a lot of pain.        Review of systems:  ROS as above.       Allergies:    Allergies   Allergen Reactions   ? Sulfur ITCHING        ? acetaminophen (TYLENOL EXTRA STRENGTH) 500 mg tablet Take two tablets by mouth every 6 hours as needed. Max of 4,000 mg of acetaminophen in 24 hours.   ? allopurinoL (ZYLOPRIM) 300 mg tablet Take one tablet by mouth daily. Take with food.   ? amLODIPine (NORVASC) 5 mg tablet Take one tablet by mouth daily.   ? amoxicillin-potassium clavulanate (AUGMENTIN) 875/125 mg tablet Take one tablet by mouth twice daily with meals for 9 days.   ? blood sugar diagnostic (GLUCOCARD EXPRESSION) test strip Use one strip as directed three times daily before meals. ICD-10: Type 2 diabetes with hyperglycemia, insulin use E11.65, Z79.4   ? blood-glucose meter kit ICD-10: Type 2 diabetes with hyperglycemia, insulin use E11.65, Z79.4   ? carvediloL (COREG) 6.25 mg tablet Take one tablet by mouth twice daily. Take with food.   ? glucose (DEX4 GLUCOSE) 4 gram chewable tablet Chew four tablets by mouth as Needed.   ? insulin aspart (U-100) (NOVOLOG FLEXPEN U-100 INSULIN) 100 unit/mL (3 mL) PEN Inject twelve Units under the skin three times daily with meals.   ? insulin detemir U-100 (LEVEMIR FLEXTOUCH) 100 unit/mL (3 mL) injection pen Inject thirty Units under the skin twice daily.   ? lancets MISC Use one each as directed three times daily before meals. ICD-10: Type 2 diabetes with hyperglycemia, insulin use E11.65, Z79.4   ? oxyCODONE (ROXICODONE) 5 mg tablet  Take one tablet by mouth every 8 hours as needed.   ? pantoprazole DR (PROTONIX) 40 mg tablet Take one tablet by mouth daily.   ? pen needle, diabetic (TECHLITE PEN NEEDLE) 32 gauge x 5/32 pen needle Use one each as directed as Needed.   ? potassium chloride SR (KLOR-CON M20) 20 mEq tablet Take one tablet by mouth twice daily. Take with a meal and a full glass of water.   ? torsemide (DEMADEX) 20 mg tablet Take two tablets by mouth twice daily.         Vitals:    03/31/22 0820   BP: (P) 121/74   Pulse: (P) 79   PainSc: Zero   Height: 172.7 cm (5' 8)     Physical Exam:  Gen: Alert, well appearing, no acute distress  HEENT:  EOM grossly intact, no scleral icterus, wound vac in place without surrounding erythema, no tenderness  Lungs:  CTAB/L, no wheezes or crackles  Heart:  RRR, no murmur  Skin:  No rash      Lab/Radiology/Other Diagnostic Tests:  CBC w/Diff    Lab Results   Component Value Date/Time    WBC 7.6 03/24/2022 02:49 AM    RBC 4.42 03/24/2022 02:49 AM    HGB 13.2 (L) 03/24/2022 02:49 AM    HCT 39.0 (L) 03/24/2022 02:49 AM    MCV 88.4 03/24/2022 02:49 AM    MCH 29.9 03/24/2022 02:49 AM    MCHC 33.8 03/24/2022 02:49 AM    RDW 14.5 03/24/2022 02:49 AM    PLTCT 277 03/24/2022 02:49 AM    MPV 7.4 03/24/2022 02:49 AM    Lab Results   Component Value Date/Time    NEUT 66 03/24/2022 02:49 AM    ANC 5.02 03/24/2022 02:49 AM    LYMA 24 03/24/2022 02:49 AM    ALC 1.78 03/24/2022 02:49 AM    MONA 7 03/24/2022 02:49 AM    AMC 0.54 03/24/2022 02:49 AM    EOSA 2 03/24/2022 02:49 AM    AEC 0.16 03/24/2022 02:49 AM    BASA 1 03/24/2022 02:49 AM    ABC 0.08 03/24/2022 02:49 AM         Comprehensive Metabolic Profile    Lab Results   Component Value Date/Time    NA 138 03/24/2022 02:49 AM    K 3.4 (L) 03/24/2022 02:49 AM    CL 99 03/24/2022 02:49 AM    CO2 30 03/24/2022 02:49 AM    GAP 9 03/24/2022 02:49 AM    BUN 14 03/24/2022 02:49 AM    CR 0.67 03/24/2022 02:49 AM    GLU 168 (H) 03/24/2022 02:49 AM    Lab Results   Component Value Date/Time    CA 9.1 03/24/2022 02:49 AM    PO4 3.5 03/20/2022 02:34 AM    ALBUMIN 3.3 (L) 03/24/2022 02:49 AM    TOTPROT 7.1 03/24/2022 02:49 AM    ALKPHOS 169 (H) 03/24/2022 02:49 AM    AST 19 03/24/2022 02:49 AM    ALT 25 03/24/2022 02:49 AM    TOTBILI 0.4 03/24/2022 02:49 AM         Microbiology:  -03/08/22 blood cx ([facility]): Group C Streptococcus (S to penicillin, levofloxacin, CTX)  -03/10/22 blood cx ([facility]): Group C Streptococcus  -03/14/22 blood cx x2: NGTD  -03/17/22 R chest wall swab - G/S few PMNs, rare GPC, Cx: Streptococcus dysgalactiae  -03/17/22 R chest wall swab - G/s few PMNs, rare GPC; Cx: Streptococcus dysgalactiae  -03/17/22  tissue R chest wall - G/S many PMNs, NOS; Cx: Streptococcus dysgalactiae  -03/19/22 tissue Rt neck - G/S rare PMNs, GPC; Cx NGTD  ?    ?  Radiology:  -03/09/22 CT neck: mild skin thickening with inflammatory stranding and muscle edema of R side of neck  -03/09/22 TTE: technically difficult study, no clinically significant valvular disease  -03/13/22 CT chest w/o: phlegmon involving R side of the neck encasing manubrium and extending into anterior mediastinum and R lung apex  -03/13/22 MRI C spine: infection w/i the R carotid space and R side of the neck with extension into preverterbal soft tissuee and mediastinum  -03/16/22 CT chest: Large chest wall gas and fluid containing collection with adjacent fat stranding surrounding the right sternoclavicular joint and portions of the   right anterior first and second ribs with associated extension into the   anterior right pleural space and upper mediastinum compatible with   suspected sternoclavicular joint abscess  -03/16/22 CT neck w/: Right cervical extension of the right sternoclavicular joint infection with involvement of the right strap and sternocleidomastoid muscles by the   complex gas and fluid collection consistent with infectious myositis, RIJ remains grossly patent  -03/16/22 TEE w/o valvular vegetations  ?           Assessment / Plan :  61 y.o. with history of IDDM, HLD who presents for follow up of Streptococcal bacteremia secondary to necrotizing skin/soft tissue infection of the neck. Pt initially was admitted to [facility] ([place], [place]) on 03/09/11 with complaints of neck pain and found to have Streptococcal bacteremia. At OSH he had increase in WBC and repeat imaging showed infection w/i the R carotid space and R side of the neck with extension into preverterbal soft tissuee and mediastinum. CT chest (03/13/22) showed phlegmon involving R side of the neck encasing manubrium and extending into anterior mediastinum and R lung apex. He was subsequently transferred to [facility] on 03/14/22. At [facility] his blood cx were negative (03/14/22). Repeat imaging showed continued progression so was taken to OR on 03/17/22  for debridement (ENT and CTS), purulence extended ~7 cm into prepleural space, wound vac was placed. He was taken back to OR on 8/24 and no additional purulence or necrotic tissue noted. S/p extraction of multiple teeth (8/25). He received IV antibiotics while hospitalized and was transitioned to Augmentin on discharge.     Pt is doing well. He has completed over 2 weeks of antibiotics since clearance of blood cultures. On 04/02/22 he will complete 2 weeks of antibiotics from last surgery. Received pictures from wound vac change on 03/27/22 and wound base appeared clean however there is tunnelling. He has CTS follow up on 04/09/22. Will plan to continue antibiotics at that time. I discussed with [name], CTS, who will notify me how wound is looking at that visit.     ?  ID problem list:  1. Necrotizing R neck cellulitis complicated by sternoclavicular abscess  2. Group C bacteremia  3. Poor dentition  4. IDDM  5. HLD    Recommendations:  -Planning to continue Augmentin 875/125 mg PO BID until CTS follow up. Prescription refilled today  -Final duration and ID follow up to be guided on how wound appears at 04/09/22 CTS appoint  -Discussed with [name], CTS    Mamie Nick, MD  Division of Infectious Diseases  Pager [phone]  Please contact via Voalte  Total Time Today was 30 minutes in the following activities: Obtaining and/or reviewing separately obtained history, Performing a medically appropriate examination and/or evaluation, Counseling and educating the patient/family/caregiver, Ordering medications, tests, or procedures, Documenting clinical information in the electronic or other health record, Independently interpreting results (not separately reported) and communicating results to the patient/family/caregiver and Care coordination (not separately reported)

## 2022-03-31 ENCOUNTER — Encounter: Admit: 2022-03-31 | Discharge: 2022-03-31 | Payer: BC Managed Care – HMO

## 2022-03-31 ENCOUNTER — Ambulatory Visit: Admit: 2022-03-31 | Discharge: 2022-03-31 | Payer: BC Managed Care – HMO

## 2022-03-31 DIAGNOSIS — R7881 Bacteremia: Secondary | ICD-10-CM

## 2022-03-31 DIAGNOSIS — I10 Essential (primary) hypertension: Secondary | ICD-10-CM

## 2022-03-31 DIAGNOSIS — E119 Type 2 diabetes mellitus without complications: Secondary | ICD-10-CM

## 2022-03-31 MED ORDER — AMOXICILLIN-POT CLAVULANATE 875-125 MG PO TAB
1 | ORAL_TABLET | Freq: Two times a day (BID) | ORAL | 0 refills | 7.00000 days | Status: AC
Start: 2022-03-31 — End: ?

## 2022-04-03 ENCOUNTER — Encounter: Admit: 2022-04-03 | Discharge: 2022-04-03 | Payer: BC Managed Care – HMO

## 2022-04-03 NOTE — Telephone Encounter
Attempted to call patient to remind them to get cxr done prior to appointment. No answer.

## 2022-04-06 ENCOUNTER — Encounter: Admit: 2022-04-06 | Discharge: 2022-04-06 | Payer: BC Managed Care – HMO

## 2022-04-06 NOTE — Telephone Encounter
Received call from [place], Jon Pope requesting adjustment to wound vac care orders.  Patient has OV on 9/14.  Verbal order provided that office will change wound vac dressing on 9/14.  Home health to change 9/11 and resume M/W/F week of 9/18 unless new orders are provided after office visit.  Beatriz Stallion, RN

## 2022-04-09 ENCOUNTER — Ambulatory Visit: Admit: 2022-04-09 | Discharge: 2022-04-09 | Payer: BC Managed Care – HMO

## 2022-04-09 ENCOUNTER — Encounter: Admit: 2022-04-09 | Discharge: 2022-04-09 | Payer: BC Managed Care – HMO

## 2022-04-09 DIAGNOSIS — R7881 Bacteremia: Secondary | ICD-10-CM

## 2022-04-09 DIAGNOSIS — I10 Essential (primary) hypertension: Secondary | ICD-10-CM

## 2022-04-09 DIAGNOSIS — E119 Type 2 diabetes mellitus without complications: Secondary | ICD-10-CM

## 2022-04-10 ENCOUNTER — Encounter: Admit: 2022-04-10 | Discharge: 2022-04-10 | Payer: BC Managed Care – HMO

## 2022-04-10 NOTE — Telephone Encounter
Pt followed up with CTS on 04/09/22, I was not able to attend visit but spoke with Dr. Leeann Must and wound base was clean with no signs of further infection. Pt only had one tablet of Augmentin left so planned to finish last dose and stop antibiotics.    Called Pt this morning to follow up and discuss above. He expressed understanding. At time of call he had visiting nurse who had questions about medication list. Informed her I am the ID physician but I can tell her what was on his discharge paper work. Reviewed medications in question, specifically Torsemide and potassium chloride and doses that were continued on discharge. She asked about Lovenox which he received during hospital stay, informed her this was for prophylaxis based on chart review. Encouraged Pt to follow up with PCP for questions regarding medications.    ID follow up prn.     Oren Section, MD  Division of Infectious Diseases  Pager [phone]  Please contact via Voalte

## 2022-04-17 ENCOUNTER — Encounter: Admit: 2022-04-17 | Discharge: 2022-04-17 | Payer: BC Managed Care – HMO

## 2022-04-17 NOTE — Telephone Encounter
Wound picture received.

## 2022-04-17 NOTE — Telephone Encounter
Received call from Leda Gauze, home health RN for patient requesting verbal orders for collagen for wound. RN would like to use Puracol specifically for tunneled portion of wound, and states patient is requesting it. Discussed with Grant Ruts, APRN who approves orders. RN reports that wound is improving. CNC requested updated photo of wound. Earney Mallet, RN

## 2022-05-12 ENCOUNTER — Encounter: Admit: 2022-05-12 | Discharge: 2022-05-12 | Payer: BC Managed Care – HMO

## 2022-05-12 NOTE — Telephone Encounter
10/17-CNC has not received photos from home health RN. Call placed to RN. RN states she got very busy yesterday and plans to send them today. Earney Mallet, RN

## 2022-05-12 NOTE — Telephone Encounter
Received call from home health RN, Leda Gauze, updating Amelia that they began a wound vac pause on Friday due to concern for appropriate healing and tunneling. Leda Gauze states that they have been proceeding with wet-to-dry dressings with hydrofera blue since Friday. Wound care RN with home health agency is following patient and scheduled with patient on Wednesday. Provided Leda Gauze with [facility] e-mail to send updated wound photos, awaiting photos at this time. Earney Mallet, RN

## 2022-05-15 ENCOUNTER — Encounter: Admit: 2022-05-15 | Discharge: 2022-05-15 | Payer: BC Managed Care – HMO

## 2022-05-15 NOTE — Telephone Encounter
Home health called to update that patient saw his PCP (Dr. Isidore Moos) on 10/18 who performed wound care and cautery. He had additional follow up with his office today at 2:00p and appointment to establish in the wound care clinic on 10/23. Patient doing well otherwise.  Fortunato Curling, RN

## 2022-05-18 ENCOUNTER — Encounter: Admit: 2022-05-18 | Discharge: 2022-05-18 | Payer: BC Managed Care – HMO

## 2022-05-18 NOTE — Progress Notes
Date of Service: 05/20/2022       Subjective:             Jon Pope is a 61 y.o. male.      History of Present Illness  Jon Pope presents to thoracic surgery clinic for postoperative care- evaluation of wound- concern for tunneling and need for debridement. Jon Pope presented to our service with inpatient consultation for a persistent bacteremia with CT imaging concerning for [facility] joint/lateral neck infection.  He underwent a Incision and debridement of the right sternoclavicular joint and wound vacuum placement with [name] on 03/17/22, ENT performed an I&D of deep neck space infection.    ?  He returned to the OR on 8/24 with ENT for exploration of the wound. ?There was no purulence seen at that time, only healthy bleeding tissue without necrosis. ?Wound VAC was replaced. ?Patient was found to have poor dentition, went to the OR with dentistry on 8/25 for extraction of tooth 3, 15, 18-20, 27, 30 (bilateral)  ?  Patient was initially treated with IV vancomycin, Zosyn, clindamycin on arrival to [facility]. ?He was transitioned to IV Unasyn on 8/25. ?He will be discharged on p.o. Augmentin to complete a 2-week course from last OR date.  ?  A TEE was performed that was negative for vegetations.  ?  Patient's wound VAC was changed on 8/28. ?Home health was set up for wound VAC care. ?He has follow-up appointment scheduled with CTS and infectious disease.  ?  After surgery patient with fluid overload and atelectasis that improved with resumption of his PTA diuretics and aggressive use of incentive spirometry.  ?  Patient with uncontrolled hyperglycemia this visit, he was started on 12 units of mealtime insulin in addition to 30 units Lantus twice daily. ?He was instructed to record his postmeal, nighttime, a.m. blood glucose levels and follow-up with his primary care doctor in 1-2 weeks.  ?  His PTA Coreg dose was decreased as his blood pressure was well controlled at lower dose this admission.  ?  Pending items: Follow-up with CTS and infectious disease as scheduled. ?We will need to determine when appropriate to remove wound VAC. ?Instructed him to follow-up with PCP to review blood glucose readings at home.??Advised patient to inquire with PCP/medical supply company how to best replace faulty parts for his CPAP machine.  ?  03/17/22 Surgical Cultures  Right Chest Wall Tissue: One colony STREPTOCOCCUS DYSGALACTIAE BETA STREPTOCOCCUS GROUP G Beta-hemolytic streptococci are universally susceptible to penicillins. Susceptibility testing not routinely performed.  ?  03/31/22 ID follow up - Dr Mikki Harbor:   -Planning to continue Augmentin 875/125 mg PO BID until CTS follow up. Prescription refilled today  -Final duration and ID follow up to be guided on how wound appears at 04/09/22 CTS appoint  -Discussed with [name], CTS  04/10/22 ID Dr. Mikki Harbor telephone encounter- ID follow up PRN, pt to complete Augmentin  ?  Home health is managing his wound vac. Per communication this has been d/c and change to wet to dry due to tunneling    Today he presents with his brother for wound evaluation, prompted by his PCP and Wound nurse. The wound itself has not had any odor, purulence, or pain per the patient's report. There has been oozing, thin yellowish/pink fluid, that has not been odorous. The patient had received cautery by the PCP office a couple of times last week after the wound did have some bleeding. They sent the patient  to thoracic surgery to evaluate if wound needed debrided ment or not. His PCP started him on Augmentin. Blood sugars at home have not been tightly controlled, said he can be 170-190's, is on long acting and short acting insulin managed by his PCP.        ROS  Constitutional: Negative.   HENT: Negative.    Eyes: Negative.    Cardiovascular: Negative.    Respiratory: Negative.    Endocrine: Negative.    Hematologic/Lymphatic: Negative.    Skin: Negative.    Musculoskeletal: Negative.    Gastrointestinal: Negative. Genitourinary: Negative.    Neurological: Negative.    Psychiatric/Behavioral: Negative.    Allergic/Immunologic: Negative.    ?  ?    Objective:         ? acetaminophen (TYLENOL EXTRA STRENGTH) 500 mg tablet Take two tablets by mouth every 6 hours as needed. Max of 4,000 mg of acetaminophen in 24 hours.   ? allopurinoL (ZYLOPRIM) 300 mg tablet Take one tablet by mouth daily. Take with food.   ? amLODIPine (NORVASC) 5 mg tablet Take one tablet by mouth daily.   ? amoxicillin-potassium clavulanate (AUGMENTIN) 875/125 mg tablet Take one tablet by mouth twice daily. Patient takes 875 mg tablets twice a day   ? blood sugar diagnostic (GLUCOCARD EXPRESSION) test strip Use one strip as directed three times daily before meals. ICD-10: Type 2 diabetes with hyperglycemia, insulin use E11.65, Z79.4   ? blood-glucose meter kit ICD-10: Type 2 diabetes with hyperglycemia, insulin use E11.65, Z79.4   ? carvediloL (COREG) 6.25 mg tablet Take one tablet by mouth twice daily. Take with food.   ? glucose (DEX4 GLUCOSE) 4 gram chewable tablet Chew four tablets by mouth as Needed.   ? insulin aspart (U-100) (NOVOLOG FLEXPEN U-100 INSULIN) 100 unit/mL (3 mL) PEN Inject twelve Units under the skin three times daily with meals.   ? insulin detemir U-100 (LEVEMIR FLEXTOUCH) 100 unit/mL (3 mL) injection pen Inject thirty Units under the skin twice daily. (Patient taking differently: Inject thirty six Units under the skin twice daily. Patient takes 36 units subcutaneously twice a day.)   ? lancets MISC Use one each as directed three times daily before meals. ICD-10: Type 2 diabetes with hyperglycemia, insulin use E11.65, Z79.4   ? oxyCODONE (ROXICODONE) 5 mg tablet Take one tablet by mouth every 8 hours as needed.   ? pantoprazole DR (PROTONIX) 40 mg tablet Take one tablet by mouth daily.   ? pen needle, diabetic (TECHLITE PEN NEEDLE) 32 gauge x 5/32 pen needle Use one each as directed as Needed.   ? potassium chloride SR (KLOR-CON M20) 20 mEq tablet Take one tablet by mouth twice daily. Take with a meal and a full glass of water.   ? torsemide (DEMADEX) 20 mg tablet Take two tablets by mouth twice daily.     Vitals:    05/20/22 1000   BP: 100/70   BP Source: Arm, Right Upper   Pulse: 73   Temp: 37 ?C (98.6 ?F)   SpO2: 94%   O2 Device: None (Room air)   TempSrc: Oral   PainSc: Zero   Weight: 120.7 kg (266 lb)   Height: 172.7 cm (5' 8)     Body mass index is 40.45 kg/m?Marland Kitchen     Physical Exam  Vitals reviewed.   Constitutional:       Appearance: Normal appearance.   HENT:      Head: Normocephalic and atraumatic.   Eyes:  Extraocular Movements: Extraocular movements intact.   Cardiovascular:      Rate and Rhythm: Normal rate.   Pulmonary:      Effort: Pulmonary effort is normal.   Abdominal:      Palpations: Abdomen is soft.   Musculoskeletal:      Cervical back: Normal range of motion.   Skin:     General: Skin is warm and dry.      Capillary Refill: Capillary refill takes less than 2 seconds.      Comments: See photo for wound    Neurological:      Mental Status: He is alert and oriented to person, place, and time.   Psychiatric:         Mood and Affect: Mood normal.         Behavior: Behavior normal.         Thought Content: Thought content normal.         Judgment: Judgment normal.           R SCJ wound; 4.5cm x 1 cm  -Serous fluid output with movement of upper extremities, no foul odor, no creamy output expressed.   -Wound bed is moist, dark pink  -Q-Tip ran along edges, less than 0.5cm undermining of proximal wound, with no tunneling noted.   -Cleansed with saline, covered with dry gauze and transparent film dressing.       Assessment and Plan:  1. Visit for wound check    2. Type 2 diabetes mellitus with diabetic polyneuropathy, with long-term current use of insulin (HCC)    3. Delayed wound healing        Reviewed wound thoroughly. No tunneling appreciated with exam. Some serous output that was thin, non odorous. Patient has no constitutional symptoms of fevers, chills, or pain. Had [name] come bedside to evaluate. Does not think any surgical intervention needed. Ok to send home with dry-dry dressing changes BID and PRN when soiled. Will send home health health orders today. Continue follow up with PCP for ongoing medical care. [place] can see [place] on as needed basis.    *Discussed findings and wound care with home health nurse, Jola Babinski.     Huan Pollok Crane-McCallister, APRN-C  Lung Cancer Screening and Thoracic Surgery   [facility] [place] [facility]  Phone [phone]- Fax [phone]   [id number]  [address] [place], [place] [address]

## 2022-05-18 NOTE — Telephone Encounter
Received call from patient stating he saw his PCP and wound clinic on Friday 10/20 and was told to follow up with thoracic surgeon for evaluation of possible need for debridement of wound. Patient states home health has continued to follow him and the wound vac has remained off per their recommendation. Patient reports that provider was concerned about tunneling. Patient is not active in [facility] and says he is unable to provide photos of wound. Denies fever/chills, redness, cough, SOB, pain. Discussed with Vernie Murders, APRN who recommends patient come into clinic for evaluation. Patient scheduled for office visit on Wednesday 10/23 due to room availability. Patient agreeable to plan. Earney Mallet, RN

## 2022-05-20 ENCOUNTER — Ambulatory Visit: Admit: 2022-05-20 | Discharge: 2022-05-20 | Payer: BC Managed Care – HMO

## 2022-05-20 ENCOUNTER — Encounter: Admit: 2022-05-20 | Discharge: 2022-05-20 | Payer: BC Managed Care – HMO

## 2022-05-20 DIAGNOSIS — T148XXD Other injury of unspecified body region, subsequent encounter: Secondary | ICD-10-CM

## 2022-05-20 DIAGNOSIS — E119 Type 2 diabetes mellitus without complications: Secondary | ICD-10-CM

## 2022-05-20 DIAGNOSIS — I10 Essential (primary) hypertension: Secondary | ICD-10-CM

## 2022-05-20 DIAGNOSIS — Z5189 Encounter for other specified aftercare: Secondary | ICD-10-CM

## 2022-05-20 DIAGNOSIS — E1142 Type 2 diabetes mellitus with diabetic polyneuropathy: Secondary | ICD-10-CM

## 2022-06-17 ENCOUNTER — Encounter: Admit: 2022-06-17 | Discharge: 2022-06-17 | Payer: BC Managed Care – HMO

## 2022-06-25 ENCOUNTER — Encounter: Admit: 2022-06-25 | Discharge: 2022-06-25 | Payer: BC Managed Care – HMO

## 2022-06-25 NOTE — Telephone Encounter
Patient returned call. Agreeable to clinic visit tomorrow, 12/1. Lamount Cranker, RN

## 2022-06-25 NOTE — Progress Notes
Date of Service: 06/26/2022    Name: Jon Pope          DOB: 04-04-1961          MRN: [id number]    Referring Provider: Arlyce Harman  PCP: Lona Kettle    Chief Complaint   Patient presents with   ? Follow Up       History of Present Illness  Jon Pope is a 61 y.o. male presents to thoracic surgery clinic for postoperative care- evaluation of wound- concern for tunneling and need for debridement. Jon Pope?presented to our service with inpatient consultation for a?persistent bacteremia with CT imaging concerning for [facility] joint/lateral neck infection.??He?underwent a Incision and debridement of the right sternoclavicular joint?and wound vacuum placement with [name] on 03/17/22, ENT performed an I&D of deep neck space infection.??  ?  He returned to the OR on 8/24 with ENT for exploration of the wound. ?There was no purulence seen at that time, only healthy bleeding tissue without necrosis. ?Wound VAC was replaced. ?Patient was found to have poor dentition, went to the OR with dentistry on 8/25 for extraction of tooth 3, 15, 18-20, 27, 30 (bilateral)  ?  Patient was initially treated with IV vancomycin, Zosyn, clindamycin on arrival to [place]. ?He was transitioned to IV Unasyn on 8/25. ?He will be discharged on p.o. Augmentin to complete a 2-week course from last OR date.  ?  A TEE was performed that was negative for vegetations.  ?  Patient's wound VAC was changed on 8/28. ?Home health was set up for wound VAC care. ?He has follow-up appointment scheduled with CTS and infectious disease.  ?  After surgery patient with fluid overload and atelectasis that improved with resumption of his PTA diuretics and aggressive use of incentive spirometry.  ?  Patient with uncontrolled hyperglycemia this visit, he was started on 12 units of mealtime insulin in addition to 30 units Lantus twice daily. ?He was instructed to record his postmeal, nighttime, a.m. blood glucose levels and follow-up with his primary care doctor in 1-2 weeks.  ?  His PTA Coreg dose was decreased as his blood pressure was well controlled at lower dose this admission.  ?  ?  03/17/22 Surgical Cultures  Right Chest Wall Tissue:?One colony STREPTOCOCCUS DYSGALACTIAE BETA STREPTOCOCCUS GROUP G Beta-hemolytic streptococci are universally susceptible to penicillins. Susceptibility testing not routinely performed.  ?  03/31/22 ID follow up - Dr Mikki Harbor:   -Planning to continue Augmentin 875/125 mg PO BID until CTS follow up. Prescription refilled today  -Final duration and ID follow up to be guided on how wound appears at 04/09/22 CTS appoint  -Discussed with [name], CTS  04/10/22 ID Dr. Mikki Harbor telephone encounter- ID follow up PRN, pt to complete Augmentin  ?  Home health was managing his wound vac.?Per communication it had been d/c'd 05/08/22, and changed to wet to dry due to tunneling.  ?  05/20/22 presented to CTS clinic with his brother for wound evaluation, prompted by his PCP and Wound nurse. The wound itself has not had any odor, purulence, or pain per the patient's report. There has been oozing, thin yellowish/pink fluid, that has not been odorous. The patient had received cautery by the PCP office 2 times last week after the wound did have some bleeding. They sent the patient to thoracic surgery to evaluate if wound needed debrided ment or not. His PCP started him on Augmentin. Blood sugars at home have not been tightly  controlled, said he can be 170-190's, is on long acting and short acting insulin managed by his PCP.     06/25/22 - Received call from nurse at [facility] requesting CTS follow up with patient asap regarding patient's wound. Per [facility] patient is having increased pressure at wound site with copious amount of drainage. Patient had recent CT neck with findings concerning for sinus tract. CNC requested updated wound pictures. Received updated wound pictures, CT neck images and report and reviewed with [name]. [name] agreeable to offering patient clinic visit with NP tomorrow. Call placed to patient to schedule.  Photo from OSH visit         Today Jon Pope presents with symptoms of nonhealing SCJ wound following debridement  Blood sugars 250 in the morning.   Wound care has been completed BID with dressing changes by brother        History   Medical History:   Diagnosis Date   ? Diabetes (HCC)    ? HTN (hypertension)      Surgical History:   Procedure Laterality Date   ? Incision and debridement of the right sternoclavicular joint Right 03/17/2022    Performed by Arlyce Harman, MD at [facility] CVOR   ? NEGATIVE PRESSURE WOUND THERAPY FOR WOUND AREA GREATER THAN 50 SQ CM Right 03/17/2022    Performed by Arlyce Harman, MD at [facility] CVOR   ? EXPLORATION POSTOPERATIVE WOUND - NECK Right 03/19/2022    Performed by Audrea Muscat, MD at [facility] OR   ? EXTRACTION TOOTH: #3, 15, 18-20, 27, 30 Bilateral 03/20/2022    Performed by Jane Canary, DDS at [facility] OR     Social History     Socioeconomic History   ? Marital status: Single   Tobacco Use   ? Smoking status: Never   ? Smokeless tobacco: Never   Vaping Use   ? Vaping Use: Never used   Substance and Sexual Activity   ? Alcohol use: Not Currently   ? Drug use: Never     Vaping/E-liquid Use   ? Vaping Use Never User               History reviewed. No pertinent family history.  Allergies   Allergen Reactions   ? Sulfur ITCHING       There is no immunization history on file for this patient.      Medications:   ? acetaminophen (TYLENOL EXTRA STRENGTH) 500 mg tablet Take two tablets by mouth every 6 hours as needed. Max of 4,000 mg of acetaminophen in 24 hours.   ? allopurinoL (ZYLOPRIM) 300 mg tablet Take one tablet by mouth daily. Take with food.   ? amLODIPine (NORVASC) 5 mg tablet Take one tablet by mouth daily.   ? amoxicillin-potassium clavulanate (AUGMENTIN) 875/125 mg tablet Take one tablet by mouth twice daily. Patient takes 875 mg tablets twice a day   ? blood sugar diagnostic (GLUCOCARD EXPRESSION) test strip Use one strip as directed three times daily before meals. ICD-10: Type 2 diabetes with hyperglycemia, insulin use E11.65, Z79.4   ? blood-glucose meter kit ICD-10: Type 2 diabetes with hyperglycemia, insulin use E11.65, Z79.4   ? carvediloL (COREG) 6.25 mg tablet Take one tablet by mouth twice daily. Take with food.   ? glucose (DEX4 GLUCOSE) 4 gram chewable tablet Chew four tablets by mouth as Needed.   ? insulin aspart (U-100) (NOVOLOG FLEXPEN U-100 INSULIN) 100 unit/mL (3 mL) PEN Inject twelve Units under the  skin three times daily with meals.   ? insulin detemir U-100 (LEVEMIR FLEXTOUCH) 100 unit/mL (3 mL) injection pen Inject thirty Units under the skin twice daily. (Patient taking differently: Inject thirty six Units under the skin twice daily. Patient takes 40 units subcutaneously twice a day.)   ? lancets MISC Use one each as directed three times daily before meals. ICD-10: Type 2 diabetes with hyperglycemia, insulin use E11.65, Z79.4   ? oxyCODONE (ROXICODONE) 5 mg tablet Take one tablet to two tablets by mouth every 6 hours as needed for Pain (Take 30 min to 1 hour prior to dressing changes).   ? pantoprazole DR (PROTONIX) 40 mg tablet Take one tablet by mouth daily.   ? pen needle, diabetic (TECHLITE PEN NEEDLE) 32 gauge x 5/32 pen needle Use one each as directed as Needed.   ? potassium chloride SR (KLOR-CON M20) 20 mEq tablet Take one tablet by mouth twice daily. Take with a meal and a full glass of water.   ? torsemide (DEMADEX) 20 mg tablet Take two tablets by mouth twice daily.       ROS   Review of Systems   Constitutional: Negative.   HENT: Negative.    Eyes: Negative.    Cardiovascular: Negative.    Respiratory: Negative.         Feels like something pushing against throat. No shortness of breath or coughs associated    Endocrine: Negative.    Hematologic/Lymphatic: Negative.    Skin: Negative.    Musculoskeletal: Negative.    Gastrointestinal: Negative.    Genitourinary: Negative. Neurological: Negative.    Psychiatric/Behavioral: Negative.    Allergic/Immunologic: Negative.      Review of systems Obtained from patient    Physical Exam    Objective  Vitals:    06/26/22 1254   BP: 132/70   Pulse: 66   SpO2: 97%       Primary Diagnosis:   Encounter Diagnoses   Name Primary?   ? Sternal Clavicular Joint Infection          Currently Active Problems:  Patient Active Problem List    Diagnosis Date Noted   ? 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 Streptococcal bacteremia 03/14/2022   ? Type 2 diabetes mellitus, with long-term current use of insulin (HCC) 03/14/2022   ? Primary hypertension 03/14/2022   ? HLD (hyperlipidemia) 03/14/2022   ? Gout 03/14/2022   ? Lymphedema due to venous insufficiency 03/14/2022   ? Chronic right shoulder pain 03/14/2022   ? Left lower lobe pneumonia 03/14/2022       Assessment and Plan    1. Sternal Clavicular Joint Infection           Jon Pope is a 61 y.o. male presents to thoracic surgery clinic for postoperative care- evaluation of wound- concern for tunneling and need for debridement. Jon Pope?presented to our service with inpatient consultation for a?persistent bacteremia with CT imaging concerning for [facility] joint/lateral neck infection.??He?underwent a Incision and debridement of the right sternoclavicular joint?and wound vacuum placement with [name] on 03/17/22, ENT performed an I&D of deep neck space infection.??    Wound opened up further today to access 4 cm tunneling.  Silver nitrate applied to areas that were bleeding and pressure applied.  Bleeding stopped.  Wound packed with 1/2 plain packing.  Pt's brother will continue packing BID at home.  Supplies provided.  Blood sugars have been elevated.  This morning 250 which is increased  from the day prior.  PCP office called to discuss.  OK to increase Levemir to 50 units BID.  Oxycodone prescription given to patient.  Pt to take medication prior to dressing changes.  Plan to follow up on Monday for reevaluation.    Tad Moore, APRN-C  Thoracic Surgery  Lung Cancer Screening Program  [phone]

## 2022-06-25 NOTE — Telephone Encounter
Received call from nurse at [facility] requesting CTS follow up with patient asap regarding patient's wound. Per [facility] patient is having increased pressure at wound site with copious amount of drainage. Patient had recent CT neck with findings concerning for sinus tract. CNC requested updated wound pictures. Received updated wound pictures, CT neck images and report and reviewed with [name]. [name] agreeable to offering patient clinic visit with NP tomorrow. Call placed to patient to schedule. Left VM, awaiting callback at this time.  Lamount Cranker, RN

## 2022-06-26 ENCOUNTER — Encounter: Admit: 2022-06-26 | Discharge: 2022-06-26 | Payer: BC Managed Care – HMO

## 2022-06-26 ENCOUNTER — Ambulatory Visit: Admit: 2022-06-26 | Discharge: 2022-06-26 | Payer: BC Managed Care – HMO

## 2022-06-26 DIAGNOSIS — E119 Type 2 diabetes mellitus without complications: Secondary | ICD-10-CM

## 2022-06-26 DIAGNOSIS — I10 Essential (primary) hypertension: Secondary | ICD-10-CM

## 2022-06-26 DIAGNOSIS — M869 Osteomyelitis, unspecified: Secondary | ICD-10-CM

## 2022-06-26 MED ORDER — OXYCODONE 5 MG PO TAB
5-10 mg | ORAL_TABLET | ORAL | 0 refills | 6.00000 days | Status: AC | PRN
Start: 2022-06-26 — End: ?
  Filled 2022-06-26: qty 30, 4d supply, fill #1

## 2022-06-26 NOTE — Progress Notes
Date of Service: 06/29/2022       Subjective:             Jon Pope is a 61 y.o. male.      History of Present Illness  Jon Pope is a 61 y.o. male presents to thoracic surgery clinic for postoperative care- evaluation of wound.  He was evaluated 06/26/22 wound opened and debrided.  Bleeding stopped with silver nitrate and pressure.   1/2 plain packing to be completed BID by brother.     Jon Pope?presented to our service with inpatient consultation for a?persistent bacteremia with CT imaging concerning for [facility] joint/lateral neck infection.??He?underwent a Incision and debridement of the right sternoclavicular joint?and wound vacuum placement with [name] on 03/17/22, ENT performed an I&D of deep neck space infection.??    He returned to the OR on 8/24 with ENT for exploration of the wound. ?There was no purulence seen at that time, only healthy bleeding tissue without necrosis. ?Wound VAC was replaced. ?Patient was found to have poor dentition, went to the OR with dentistry on 8/25 for extraction of tooth 3, 15, 18-20, 27, 30 (bilateral)  ?  Patient was initially treated with IV vancomycin, Zosyn, clindamycin on arrival to [place]. ?He was transitioned to IV Unasyn on 8/25. ?He will be discharged on p.o. Augmentin to complete a 2-week course from last OR date.  ?  A TEE was performed that was negative for vegetations.  ?  Patient's wound VAC was changed on 8/28. ?Home health was set up for wound VAC care. ?He has follow-up appointment scheduled with CTS and infectious disease.  ?  After surgery patient with fluid overload and atelectasis that improved with resumption of his PTA diuretics and aggressive use of incentive spirometry.    06/26/22- SCJ wound healing- external > internal with 4 cm tunneling under tissue- 1/2 plain packing BID at home.  Blood glucose elevated 250, levimer increased.  PCP notified     06/29/22 SCJ wound assessed.  4.5 cm deep   Blood sugar- this morning was 150.  Highest yesterday at lunch  Wound dressing changes- Brother changing BID with 1/2 plain packing  Pain control- oxycodone with dressing changes.  Pt state his left chest wall is very sore.    He denies fevers or chills.  Eating well trying to limit carbohydrates and sugars.  Discussed meal choices.  Discussed healthier options.               ROS  Review of Systems   Constitutional: Negative.   HENT: Negative.    Eyes: Negative.    Cardiovascular: Negative.    Respiratory: Negative.    Endocrine: Negative.    Hematologic/Lymphatic: Negative.    Skin: Negative.    Musculoskeletal: Negative.    Gastrointestinal: Negative.    Genitourinary: Negative.    Neurological: Negative.    Psychiatric/Behavioral: Negative.    Allergic/Immunologic: Negative.      Objective:         ? acetaminophen (TYLENOL EXTRA STRENGTH) 500 mg tablet Take two tablets by mouth every 6 hours as needed. Max of 4,000 mg of acetaminophen in 24 hours.   ? allopurinoL (ZYLOPRIM) 300 mg tablet Take one tablet by mouth daily. Take with food.   ? amLODIPine (NORVASC) 5 mg tablet Take one tablet by mouth daily.   ? amoxicillin-potassium clavulanate (AUGMENTIN) 875/125 mg tablet Take one tablet by mouth twice daily. Patient takes 875 mg tablets twice a day   ?  blood sugar diagnostic (GLUCOCARD EXPRESSION) test strip Use one strip as directed three times daily before meals. ICD-10: Type 2 diabetes with hyperglycemia, insulin use E11.65, Z79.4   ? blood-glucose meter kit ICD-10: Type 2 diabetes with hyperglycemia, insulin use E11.65, Z79.4   ? carvediloL (COREG) 6.25 mg tablet Take one tablet by mouth twice daily. Take with food.   ? glucose (DEX4 GLUCOSE) 4 gram chewable tablet Chew four tablets by mouth as Needed.   ? insulin aspart (U-100) (NOVOLOG FLEXPEN U-100 INSULIN) 100 unit/mL (3 mL) PEN Inject twelve Units under the skin three times daily with meals.   ? insulin detemir U-100 (LEVEMIR FLEXTOUCH) 100 unit/mL (3 mL) injection pen Inject thirty Units under the skin twice daily. (Patient taking differently: Inject thirty six Units under the skin twice daily. Patient takes 40 units subcutaneously twice a day.)   ? lancets MISC Use one each as directed three times daily before meals. ICD-10: Type 2 diabetes with hyperglycemia, insulin use E11.65, Z79.4   ? oxyCODONE (ROXICODONE) 5 mg tablet Take one tablet to two tablets by mouth every 6 hours as needed for Pain (Take 30 min to 1 hour prior to dressing changes).   ? pantoprazole DR (PROTONIX) 40 mg tablet Take one tablet by mouth daily.   ? pen needle, diabetic (TECHLITE PEN NEEDLE) 32 gauge x 5/32 pen needle Use one each as directed as Needed.   ? potassium chloride SR (KLOR-CON M20) 20 mEq tablet Take one tablet by mouth twice daily. Take with a meal and a full glass of water.   ? torsemide (DEMADEX) 20 mg tablet Take two tablets by mouth twice daily.     Vitals:    06/29/22 1104   BP: 110/70   BP Source: Arm, Left Upper   Pulse: 68   SpO2: 94%   O2 Device: None (Room air)   PainSc: Five   Weight: 119.7 kg (264 lb)   Height: 172.7 cm (5' 8)     Body mass index is 40.14 kg/m?Marland Kitchen     Physical Exam         Assessment and Plan:  1. Sternal Clavicular Joint Infection    2. Type 2 diabetes mellitus with diabetic polyneuropathy, with long-term current use of insulin [facility])           Jon Pope presents to thoracic surgery clinic for wound evaluation.  SCJ infection s/p debridement- He?underwent a Incision and debridement of the right sternoclavicular joint?and wound vacuum placement with [name] on 03/17/22.  Wound was not healing and had a 4 cm tunnel.      Plan-  Continue dressing changes with 1/2 plain packing, gauze, then tape  Take pain medication prior to dressing changes  Please reach out to Dr. Herschell Dimes to be seen for diabetic medication management   I will place a referral to a diabetic educator to discuss diabetic diet    Follow up on Thursday in clinic.     Tad Moore, APRN-C  Thoracic Surgery  Lung Cancer Screening Program  [phone]

## 2022-06-29 ENCOUNTER — Ambulatory Visit: Admit: 2022-06-29 | Discharge: 2022-06-29 | Payer: BC Managed Care – HMO

## 2022-06-29 ENCOUNTER — Encounter: Admit: 2022-06-29 | Discharge: 2022-06-29 | Payer: BC Managed Care – HMO

## 2022-06-29 DIAGNOSIS — I10 Essential (primary) hypertension: Secondary | ICD-10-CM

## 2022-06-29 DIAGNOSIS — M869 Osteomyelitis, unspecified: Secondary | ICD-10-CM

## 2022-06-29 DIAGNOSIS — E119 Type 2 diabetes mellitus without complications: Secondary | ICD-10-CM

## 2022-06-29 DIAGNOSIS — E1142 Type 2 diabetes mellitus with diabetic polyneuropathy: Secondary | ICD-10-CM

## 2022-06-29 NOTE — Patient Instructions
Continue dressing changes with 1/2" plain packing, gauze, then tape  Take pain medication prior to dressing changes  Please reach out to Dr. Herschell Dimes to be seen for diabetic medication management   I will place a referral to a diabetic educator to discuss diabetic diet    Follow up on Thursday in clinic.  Call if you need anything at all.    Tad Moore, APRN-C  Thoracic Surgery  Lung Cancer Screening Program  [phone]

## 2022-06-30 NOTE — Progress Notes
Date of Service: 07/02/2022       Subjective:             Jon Pope is a 61 y.o. male.      History of Present Illness  Jon Pope is a 61 y.o. male presents to thoracic surgery clinic for postoperative care- evaluation of wound.  He was evaluated 06/26/22 wound opened and debrided.  Bleeding stopped with silver nitrate and pressure.   1/2 plain packing to be completed BID by brother.      Jon Pope presented to our service with inpatient consultation for a persistent bacteremia with CT imaging concerning for [facility] joint/lateral neck infection.  He underwent a Incision and debridement of the right sternoclavicular joint and wound vacuum placement with [name] on 03/17/22, ENT performed an I&D of deep neck space infection.       He returned to the OR on 8/24 with ENT for exploration of the wound.  There was no purulence seen at that time, only healthy bleeding tissue without necrosis.  Wound VAC was replaced.  Patient was found to have poor dentition, went to the OR with dentistry on 8/25 for extraction of tooth 3, 15, 18-20, 27, 30 (bilateral)     Patient was initially treated with IV vancomycin, Zosyn, clindamycin on arrival to [place].  He was transitioned to IV Unasyn on 8/25.  He will be discharged on p.o. Augmentin to complete a 2-week course from last OR date.     A TEE was performed that was negative for vegetations.     Patient's wound VAC was changed on 8/28.  Home health was set up for wound VAC care.  He has follow-up appointment scheduled with CTS and infectious disease.     After surgery patient with fluid overload and atelectasis that improved with resumption of his PTA diuretics and aggressive use of incentive spirometry.     06/26/22- SCJ wound healing- external > internal with 4 cm tunneling under tissue- 1/2 plain packing BID at home.  Blood glucose elevated 250, levimer increased.  PCP notified     06/29/22 - Office visit with CTS, wound site assessed, repacked and dressed. Addressed levamir dosing and nutritional counseling given. Continue to pack BID.     Today:  SCJ wound reassessed.  3.5 cm deep. Showing a lot of improvement from last visit.   Blood sugar- this morning was 160.  Highest yesterday after dinner (200).  Wound dressing changes- Brother changing BID with 1/2 plain packing  Pain control- oxycodone with dressing changes.  Pt state his left chest wall is very sore.  He denies fevers or chills.  Eating well trying to limit carbohydrates and sugars.  See's his PCP Monday 12/11.     Media Information       Media Information              ROS  Constitutional: Negative.   HENT: Negative.     Eyes: Negative.    Cardiovascular: Negative.    Respiratory: Negative.          Dry mouth    Endocrine: Negative.    Hematologic/Lymphatic: Negative.    Skin: Negative.    Musculoskeletal: Negative.    Gastrointestinal: Negative.    Genitourinary: Negative.    Neurological: Negative.    Psychiatric/Behavioral: Negative.     Allergic/Immunologic: Negative.      Objective:          acetaminophen (TYLENOL EXTRA STRENGTH) 500 mg tablet  Take two tablets by mouth every 6 hours as needed. Max of 4,000 mg of acetaminophen in 24 hours.    allopurinoL (ZYLOPRIM) 300 mg tablet Take one tablet by mouth daily. Take with food.    amLODIPine (NORVASC) 5 mg tablet Take one tablet by mouth daily.    amoxicillin-potassium clavulanate (AUGMENTIN) 875/125 mg tablet Take one tablet by mouth twice daily. Patient takes 875 mg tablets twice a day    blood sugar diagnostic (GLUCOCARD EXPRESSION) test strip Use one strip as directed three times daily before meals. ICD-10: Type 2 diabetes with hyperglycemia, insulin use E11.65, Z79.4    blood-glucose meter kit ICD-10: Type 2 diabetes with hyperglycemia, insulin use E11.65, Z79.4    carvediloL (COREG) 6.25 mg tablet Take one tablet by mouth twice daily. Take with food.    glucose (DEX4 GLUCOSE) 4 gram chewable tablet Chew four tablets by mouth as Needed.    insulin aspart (U-100) (NOVOLOG FLEXPEN U-100 INSULIN) 100 unit/mL (3 mL) PEN Inject twelve Units under the skin three times daily with meals.    insulin detemir U-100 (LEVEMIR FLEXTOUCH) 100 unit/mL (3 mL) injection pen Inject thirty Units under the skin twice daily. (Patient taking differently: Inject thirty six Units under the skin twice daily. Patient takes 40 units subcutaneously twice a day.)    lancets MISC Use one each as directed three times daily before meals. ICD-10: Type 2 diabetes with hyperglycemia, insulin use E11.65, Z79.4    levoFLOXacin (LEVAQUIN) 750 mg tablet Take one tablet by mouth daily.    oxyCODONE (ROXICODONE) 5 mg tablet Take one tablet to two tablets by mouth every 6 hours as needed for Pain (Take 30 min to 1 hour prior to dressing changes).    pantoprazole DR (PROTONIX) 40 mg tablet Take one tablet by mouth daily.    pen needle, diabetic (TECHLITE PEN NEEDLE) 32 gauge x 5/32 pen needle Use one each as directed as Needed.    potassium chloride SR (KLOR-CON M20) 20 mEq tablet Take one tablet by mouth twice daily. Take with a meal and a full glass of water.    torsemide (DEMADEX) 20 mg tablet Take two tablets by mouth twice daily.     Vitals:    07/02/22 1217   BP: 123/72   BP Source: Arm, Left Upper   Pulse: 65   SpO2: 97%   PainSc: Zero   Weight: 119.4 kg (263 lb 4.8 oz)   Height: 172.7 cm (5' 8)     Body mass index is 40.03 kg/m?Marland Kitchen     Physical Exam  Vitals reviewed.   Constitutional:       Appearance: Normal appearance.   HENT:      Head: Normocephalic.   Eyes:      Extraocular Movements: Extraocular movements intact.   Cardiovascular:      Rate and Rhythm: Normal rate and regular rhythm.   Pulmonary:      Breath sounds: Normal breath sounds.   Abdominal:      Palpations: Abdomen is soft.   Musculoskeletal:      Cervical back: Normal range of motion.   Skin:     Capillary Refill: Capillary refill takes less than 2 seconds.      Comments: See photos above.   Neurological:      Mental Status: He is alert and oriented to person, place, and time.   Psychiatric:         Mood and Affect: Mood normal.  Behavior: Behavior normal.         Thought Content: Thought content normal.         Judgment: Judgment normal.              Assessment and Plan:  1. Visit for wound check    2. Sternal Clavicular Joint Infection    3. Delayed wound healing    4. Type 2 diabetes mellitus with diabetic polyneuropathy, with long-term current use of insulin (HCC)      RTC in 2 wks for wound check. Continue packing twice daily, until no long able to get packing in. Patients brother is helping him with wound dressings and education reinforced on packing. Questions were answered. The patient and brother request to return to [place] for a wound check, we agreed two weeks, and they can call if there are any issues prior to then. Continue working on nutrition and blood sugar control The patient sees his PCP on 12/11 for f/u.     25 minutes time spent on this encounter including review of chart, review of test results, communication and education, and documentation.    Aimee Heldman Crane-McCallister, APRN, FNP-C

## 2022-07-02 ENCOUNTER — Encounter: Admit: 2022-07-02 | Discharge: 2022-07-02 | Payer: BC Managed Care – HMO

## 2022-07-02 ENCOUNTER — Ambulatory Visit: Admit: 2022-07-02 | Discharge: 2022-07-02 | Payer: BC Managed Care – HMO

## 2022-07-02 DIAGNOSIS — T148XXD Other injury of unspecified body region, subsequent encounter: Secondary | ICD-10-CM

## 2022-07-02 DIAGNOSIS — M869 Osteomyelitis, unspecified: Secondary | ICD-10-CM

## 2022-07-02 DIAGNOSIS — E1142 Type 2 diabetes mellitus with diabetic polyneuropathy: Secondary | ICD-10-CM

## 2022-07-02 DIAGNOSIS — Z5189 Encounter for other specified aftercare: Secondary | ICD-10-CM

## 2022-07-02 DIAGNOSIS — I10 Essential (primary) hypertension: Secondary | ICD-10-CM

## 2022-07-02 DIAGNOSIS — E119 Type 2 diabetes mellitus without complications: Secondary | ICD-10-CM

## 2022-07-02 NOTE — Patient Instructions
If you have any questions, please contact Heather, Travas Schexnayder or Whitney at [phone] (M-F 8a-4:30p)  After hours, you may call [phone] (Evenings/Weekends/Holidays)

## 2022-07-13 NOTE — Progress Notes
Date of Service: 07/17/2022       Subjective:             LAKEN FINCANNON is a 61 y.o. male.      History of Present Illness  ALGERT RYLAND is a 61 y.o. male presents to thoracic surgery clinic for postoperative care- evaluation of wound.  He was evaluated 06/26/22 wound opened and debrided.  Bleeding stopped with silver nitrate and pressure.   1/2 plain packing to be completed BID by brother.      Orson Gear presented to our service with inpatient consultation for a persistent bacteremia with CT imaging concerning for [facility] joint/lateral neck infection.  He underwent a Incision and debridement of the right sternoclavicular joint and wound vacuum placement with [name] on 03/17/22, ENT performed an I&D of deep neck space infection.       He returned to the OR on 8/24 with ENT for exploration of the wound.  There was no purulence seen at that time, only healthy bleeding tissue without necrosis.  Wound VAC was replaced.  Patient was found to have poor dentition, went to the OR with dentistry on 8/25 for extraction of tooth 3, 15, 18-20, 27, 30 (bilateral)     Patient was initially treated with IV vancomycin, Zosyn, clindamycin on arrival to [place].  He was transitioned to IV Unasyn on 8/25.  He will be discharged on p.o. Augmentin to complete a 2-week course from last OR date.     A TEE was performed that was negative for vegetations.     Patient's wound VAC was changed on 8/28.  Home health was set up for wound VAC care.  He has follow-up appointment scheduled with CTS and infectious disease.     After surgery patient with fluid overload and atelectasis that improved with resumption of his PTA diuretics and aggressive use of incentive spirometry.     06/26/22- SCJ wound healing- external > internal with 4 cm tunneling under tissue- 1/2 plain packing BID at home.  Blood glucose elevated 250, levimer increased.  PCP notified      06/29/22 - Office visit with CTS, wound site assessed, repacked and dressed. Addressed levamir dosing and nutritional counseling given. Continue to pack BID.      07/02/22: SCJ wound reassessed.  3.5 cm deep. Showing a lot of improvement from last visit.   Blood sugar- this morning was 160.  Highest yesterday after dinner (200).  Wound dressing changes- Brother changing BID with 1/2 plain packing    Mr. Carmack is doing well.  He is having discomfort at the SCJ.  He is taking oxycodone as needed with dressing changes.  He denies fevers or chills.  Trying to eat better limiting carbohydrates and sugars.  His PCP has added Ozempic and glipizide to his diabetic regimen.  He did have a dietician reach out to him from [facility] but he hasn't called her back.    4.5 cm tunneling, yellow purulent drainage               ROS  Review of Systems   Constitutional: Negative.   HENT: Negative.     Eyes: Negative.    Cardiovascular: Negative.    Respiratory: Negative.     Endocrine: Negative.    Hematologic/Lymphatic: Negative.    Skin: Negative.         Patient has noticed that the wound is rising at edges again. Patient also noticed that when turning to the  left, the wound gets half an inch deeper when packing. Wound also has discharge when packing    Musculoskeletal: Negative.    Gastrointestinal: Negative.    Genitourinary: Negative.    Neurological: Negative.    Psychiatric/Behavioral: Negative.     Allergic/Immunologic: Negative.      Objective:          acetaminophen (TYLENOL EXTRA STRENGTH) 500 mg tablet Take two tablets by mouth every 6 hours as needed. Max of 4,000 mg of acetaminophen in 24 hours.    allopurinoL (ZYLOPRIM) 300 mg tablet Take one tablet by mouth daily. Take with food.    amLODIPine (NORVASC) 5 mg tablet Take one tablet by mouth daily.    amoxicillin-potassium clavulanate (AUGMENTIN) 875/125 mg tablet Take one tablet by mouth twice daily. Patient takes 875 mg tablets twice a day    blood sugar diagnostic (GLUCOCARD EXPRESSION) test strip Use one strip as directed three times daily before meals. ICD-10: Type 2 diabetes with hyperglycemia, insulin use E11.65, Z79.4    blood-glucose meter kit ICD-10: Type 2 diabetes with hyperglycemia, insulin use E11.65, Z79.4    carvediloL (COREG) 6.25 mg tablet Take one tablet by mouth twice daily. Take with food.    glipiZIDE (GLUCOTROL) 5 mg tablet Take one tablet by mouth twice daily with meals.    glucose (DEX4 GLUCOSE) 4 gram chewable tablet Chew four tablets by mouth as Needed.    insulin aspart (U-100) (NOVOLOG FLEXPEN U-100 INSULIN) 100 unit/mL (3 mL) PEN Inject twelve Units under the skin three times daily with meals.    insulin detemir U-100 (LEVEMIR FLEXTOUCH) 100 unit/mL (3 mL) injection pen Inject thirty Units under the skin twice daily. (Patient taking differently: Inject thirty six Units under the skin twice daily. Patient takes 40 units subcutaneously twice a day.)    lancets MISC Use one each as directed three times daily before meals. ICD-10: Type 2 diabetes with hyperglycemia, insulin use E11.65, Z79.4    levoFLOXacin (LEVAQUIN) 750 mg tablet Take one tablet by mouth daily.    oxyCODONE (ROXICODONE) 5 mg tablet Take one tablet to two tablets by mouth every 6 hours as needed for Pain (Take 30 min to 1 hour prior to dressing changes).    pantoprazole DR (PROTONIX) 40 mg tablet Take one tablet by mouth daily.    pen needle, diabetic (TECHLITE PEN NEEDLE) 32 gauge x 5/32 pen needle Use one each as directed as Needed.    potassium chloride SR (KLOR-CON M20) 20 mEq tablet Take one tablet by mouth twice daily. Take with a meal and a full glass of water.    semaglutide (OZEMPIC) 0.25 mg or 0.5 mg(2 mg/1.5 mL) injection PEN Inject one-quarter mg under the skin every 7 days.    torsemide (DEMADEX) 20 mg tablet Take two tablets by mouth twice daily.     Vitals:    07/17/22 1247   BP: 110/68   BP Source: Arm, Right Upper   Pulse: 75   SpO2: 94%   O2 Device: None (Room air)   PainSc: Zero   Weight: 122.5 kg (270 lb)   Height: 172.7 cm (5' 8)     Body mass index is 41.05 kg/m?Marland Kitchen     Physical Exam  Constitutional:       Appearance: Normal appearance. He is obese.   HENT:      Head: Normocephalic.   Cardiovascular:      Rate and Rhythm: Normal rate.   Pulmonary:      Effort:  Pulmonary effort is normal. No respiratory distress.   Skin:     General: Skin is warm.          Neurological:      General: No focal deficit present.      Mental Status: He is alert.   Psychiatric:         Mood and Affect: Mood normal.         Behavior: Behavior normal.         Thought Content: Thought content normal.         Judgment: Judgment normal.              Assessment and Plan:  1. Sternal Clavicular Joint Infection           Mr. Lombardi presents to thoracic surgery clinic for evaluation of SCJ wound that is having delayed healing.  Upon evaluation the wound continues to tunnel 4.5 cm.  The wound has made no progression with healing.  He is having yellow purulent drainage.  His blood sugars on occasion continue to be 300+, the best he had was two nights ago 110 at 6 pm.  He was recently started on glipazide and Ozempic last dose Sunday12/17/23. Pt to not take his dose this Sunday in plans to go to the OR on 07/21/22 for wound debridement and wound vac placement.    His brother will continue to pack wound with 1/2 inch plain packing BID.  Provided with supplies.    Labs drawn today BS 155.  WBC 5.7.      Tad Moore, APRN-C  Thoracic Surgery  Lung Cancer Screening Program  [phone]

## 2022-07-17 ENCOUNTER — Ambulatory Visit: Admit: 2022-07-17 | Discharge: 2022-07-17 | Payer: BC Managed Care – HMO

## 2022-07-17 ENCOUNTER — Encounter: Admit: 2022-07-17 | Discharge: 2022-07-17 | Payer: BC Managed Care – HMO

## 2022-07-17 ENCOUNTER — Inpatient Hospital Stay: Admit: 2022-07-17 | Discharge: 2022-07-17 | Payer: BC Managed Care – HMO

## 2022-07-17 DIAGNOSIS — E119 Type 2 diabetes mellitus without complications: Secondary | ICD-10-CM

## 2022-07-17 DIAGNOSIS — M869 Osteomyelitis, unspecified: Secondary | ICD-10-CM

## 2022-07-17 DIAGNOSIS — I10 Essential (primary) hypertension: Secondary | ICD-10-CM

## 2022-07-17 LAB — COMPREHENSIVE METABOLIC PANEL
ALBUMIN: 4.1 g/dL (ref 3.5–5.0)
ALK PHOSPHATASE: 108 U/L (ref 25–110)
ALT: 38 U/L (ref 7–56)
ANION GAP: 8 (ref 3–12)
AST: 25 U/L (ref 7–40)
BLD UREA NITROGEN: 20 mg/dL (ref 7–25)
CALCIUM: 8.9 mg/dL (ref 8.5–10.6)
CHLORIDE: 102 MMOL/L (ref 98–110)
CO2: 30 MMOL/L (ref 21–30)
CREATININE: 0.7 mg/dL (ref 0.4–1.24)
EGFR: >60 mL/min (ref >60)
POTASSIUM: 4 MMOL/L (ref 3.5–5.1)
SODIUM: 140 MMOL/L (ref 137–147)
TOTAL BILIRUBIN: 0.5 mg/dL (ref 0.3–1.2)
TOTAL PROTEIN: 6.8 g/dL (ref 6.0–8.0)

## 2022-07-17 LAB — CBC
RBC COUNT: 5 M/UL (ref 4.4–5.5)
WBC COUNT: 5.7 K/UL (ref 4.5–11.0)

## 2022-07-17 LAB — PROTIME INR (PT): INR: 1.1 mg/dL — ABNORMAL HIGH (ref 0.8–1.2)

## 2022-07-17 MED ORDER — LIDOCAINE (PF) 10 MG/ML (1 %) IJ SOLN
.2 mL | INTRAMUSCULAR | 0 refills | PRN
Start: 2022-07-17 — End: ?

## 2022-07-17 MED ORDER — SODIUM CHLORIDE 0.9 % IV SOLP
INTRAVENOUS | 0 refills
Start: 2022-07-17 — End: ?

## 2022-07-17 MED ORDER — HEPARIN, PORCINE (PF) 5,000 UNIT/0.5 ML IJ SYRG
5000 [IU] | Freq: Once | SUBCUTANEOUS | 0 refills
Start: 2022-07-17 — End: ?

## 2022-07-17 NOTE — Patient Instructions
Hold Ozempic on Sunday  Hold Glipizide on 12/26  Levimir- 12/25 PM dose 20 units   12/26- Hold morning dose    Arrive at 6:30a on [facility] front desk

## 2022-07-21 ENCOUNTER — Encounter: Admit: 2022-07-21 | Discharge: 2022-07-21 | Payer: BC Managed Care – HMO

## 2022-07-21 ENCOUNTER — Inpatient Hospital Stay: Admit: 2022-07-21 | Discharge: 2022-07-21 | Payer: BC Managed Care – HMO

## 2022-07-21 DIAGNOSIS — I10 Essential (primary) hypertension: Secondary | ICD-10-CM

## 2022-07-21 DIAGNOSIS — E119 Type 2 diabetes mellitus without complications: Secondary | ICD-10-CM

## 2022-07-21 DIAGNOSIS — M869 Osteomyelitis, unspecified: Secondary | ICD-10-CM

## 2022-07-21 MED ORDER — SUCCINYLCHOLINE CHLORIDE 20 MG/ML IJ SOLN
INTRAVENOUS | 0 refills | Status: DC
Start: 2022-07-21 — End: 2022-07-21

## 2022-07-21 MED ORDER — CEFAZOLIN 1 GRAM IJ SOLR
INTRAVENOUS | 0 refills | Status: DC
Start: 2022-07-21 — End: 2022-07-21

## 2022-07-21 MED ORDER — MIDAZOLAM 1 MG/ML IJ SOLN
INTRAVENOUS | 0 refills | Status: DC
Start: 2022-07-21 — End: 2022-07-21

## 2022-07-21 MED ORDER — SUGAMMADEX 100 MG/ML IV SOLN
INTRAVENOUS | 0 refills | Status: DC
Start: 2022-07-21 — End: 2022-07-21

## 2022-07-21 MED ORDER — ROCURONIUM 10 MG/ML IV SOLN
INTRAVENOUS | 0 refills | Status: DC
Start: 2022-07-21 — End: 2022-07-21

## 2022-07-21 MED ORDER — PROPOFOL INJ 10 MG/ML IV VIAL
INTRAVENOUS | 0 refills | Status: DC
Start: 2022-07-21 — End: 2022-07-21

## 2022-07-21 MED ORDER — ARTIFICIAL TEARS (PF) SINGLE DOSE DROPS GROUP
OPHTHALMIC | 0 refills | Status: DC
Start: 2022-07-21 — End: 2022-07-21

## 2022-07-21 MED ORDER — FENTANYL CITRATE (PF) 50 MCG/ML IJ SOLN
INTRAVENOUS | 0 refills | Status: DC
Start: 2022-07-21 — End: 2022-07-21

## 2022-07-21 MED ORDER — ACETAMINOPHEN 1,000 MG/100 ML (10 MG/ML) IV SOLN
INTRAVENOUS | 0 refills | Status: DC
Start: 2022-07-21 — End: 2022-07-21

## 2022-07-21 MED ORDER — ONDANSETRON HCL (PF) 4 MG/2 ML IJ SOLN
INTRAVENOUS | 0 refills | Status: DC
Start: 2022-07-21 — End: 2022-07-21

## 2022-07-21 MED ORDER — LIDOCAINE (PF) 200 MG/10 ML (2 %) IJ SYRG
INTRAVENOUS | 0 refills | Status: DC
Start: 2022-07-21 — End: 2022-07-21

## 2022-07-21 MED ADMIN — HYDROMORPHONE (PF) 2 MG/ML IJ SYRG [163476]: 0.5 mg | INTRAVENOUS | @ 16:00:00 | Stop: 2022-07-21 | NDC 00409131203

## 2022-07-21 MED ADMIN — SODIUM CHLORIDE 0.9 % IV SOLP [27838]: INTRAVENOUS | @ 14:00:00 | Stop: 2022-07-21 | NDC 00338004904

## 2022-07-21 MED ADMIN — OXYCODONE 5 MG PO TAB [10814]: 5 mg | ORAL | @ 16:00:00 | Stop: 2022-07-21 | NDC 00406055223

## 2022-07-21 MED ADMIN — METRONIDAZOLE 500 MG PO TAB [5016]: 500 mg | ORAL | @ 20:00:00 | NDC 72578000801

## 2022-07-21 MED ADMIN — ACETAMINOPHEN 500 MG PO TAB [102]: 1000 mg | ORAL | @ 20:00:00 | Stop: 2022-07-26 | NDC 00904673061

## 2022-07-21 MED ADMIN — GLIPIZIDE 5 MG PO TAB [10117]: 5 mg | ORAL | @ 23:00:00 | NDC 00904663761

## 2022-07-21 MED ADMIN — INSULIN ASPART 100 UNIT/ML SC FLEXPEN [87504]: 12 [IU] | SUBCUTANEOUS | @ 20:00:00 | NDC 00169633910

## 2022-07-21 MED ADMIN — CEFTRIAXONE INJ 1GM IVP [210253]: 2 g | INTRAVENOUS | @ 20:00:00 | NDC 60505614800

## 2022-07-21 MED ADMIN — TORSEMIDE 20 MG PO TAB [18293]: 40 mg | ORAL | @ 22:00:00 | NDC 68084053911

## 2022-07-21 MED ADMIN — POTASSIUM CHLORIDE 20 MEQ PO TBTQ [35943]: 20 meq | ORAL | @ 22:00:00 | NDC 00832532511

## 2022-07-21 MED ADMIN — CEFAZOLIN 1 GRAM IJ SOLR [1445]: 1000 mL | @ 15:00:00 | Stop: 2022-07-21 | NDC 00143992490

## 2022-07-21 MED ADMIN — HEPARIN, PORCINE (PF) 5,000 UNIT/0.5 ML IJ SYRG [95535]: 5000 [IU] | SUBCUTANEOUS | @ 14:00:00 | Stop: 2022-07-21 | NDC 00409131611

## 2022-07-21 MED ADMIN — SODIUM CHLORIDE 0.9 % IV SOLP [27838]: 1000 mL | @ 15:00:00 | Stop: 2022-07-21 | NDC 00264999900

## 2022-07-22 ENCOUNTER — Inpatient Hospital Stay: Admit: 2022-07-22 | Discharge: 2022-07-22 | Payer: BC Managed Care – HMO

## 2022-07-22 MED ADMIN — AMLODIPINE 5 MG PO TAB [79041]: 5 mg | ORAL | @ 14:00:00 | NDC 00904637061

## 2022-07-22 MED ADMIN — METRONIDAZOLE 500 MG PO TAB [5016]: 500 mg | ORAL | @ 14:00:00 | NDC 72578000801

## 2022-07-22 MED ADMIN — HEPARIN, PORCINE (PF) 5,000 UNIT/0.5 ML IJ SYRG [95535]: 5000 [IU] | SUBCUTANEOUS | @ 22:00:00 | NDC 00409131611

## 2022-07-22 MED ADMIN — TORSEMIDE 20 MG PO TAB [18293]: 40 mg | ORAL | @ 14:00:00 | NDC 68084053911

## 2022-07-22 MED ADMIN — GLIPIZIDE 5 MG PO TAB [10117]: 5 mg | ORAL | @ 23:00:00 | NDC 00904663761

## 2022-07-22 MED ADMIN — GLIPIZIDE 5 MG PO TAB [10117]: 5 mg | ORAL | @ 14:00:00 | NDC 00904663761

## 2022-07-22 MED ADMIN — OXYCODONE 5 MG PO TAB [10814]: 5 mg | ORAL | @ 20:00:00 | NDC 68084035411

## 2022-07-22 MED ADMIN — POLYETHYLENE GLYCOL 3350 17 GRAM PO PWPK [25424]: 17 g | ORAL | @ 03:00:00 | NDC 00904693186

## 2022-07-22 MED ADMIN — POLYETHYLENE GLYCOL 3350 17 GRAM PO PWPK [25424]: 17 g | ORAL | @ 14:00:00 | NDC 00904693186

## 2022-07-22 MED ADMIN — POTASSIUM CHLORIDE 20 MEQ PO TBTQ [35943]: 20 meq | ORAL | @ 22:00:00 | NDC 00832532511

## 2022-07-22 MED ADMIN — POTASSIUM CHLORIDE 20 MEQ PO TBTQ [35943]: 60 meq | ORAL | @ 14:00:00 | Stop: 2022-07-22 | NDC 00832532511

## 2022-07-22 MED ADMIN — CARVEDILOL 6.25 MG PO TAB [77309]: 6.25 mg | ORAL | @ 14:00:00 | NDC 00904630161

## 2022-07-22 MED ADMIN — CARVEDILOL 6.25 MG PO TAB [77309]: 6.25 mg | ORAL | @ 03:00:00 | NDC 00904630161

## 2022-07-22 MED ADMIN — OXYCODONE 5 MG PO TAB [10814]: 5 mg | ORAL | @ 05:00:00 | NDC 00406055223

## 2022-07-22 MED ADMIN — INSULIN GLARGINE 100 UNIT/ML (3 ML) SC INJ PEN [163596]: 40 [IU] | SUBCUTANEOUS | @ 04:00:00 | NDC 00088221905

## 2022-07-22 MED ADMIN — HEPARIN, PORCINE (PF) 5,000 UNIT/0.5 ML IJ SYRG [95535]: 5000 [IU] | SUBCUTANEOUS | @ 14:00:00 | NDC 00409131611

## 2022-07-22 MED ADMIN — PANTOPRAZOLE 40 MG PO TBEC [80436]: 40 mg | ORAL | @ 03:00:00 | NDC 00904647461

## 2022-07-22 MED ADMIN — METRONIDAZOLE 500 MG PO TAB [5016]: 500 mg | ORAL | @ 03:00:00 | NDC 72578000801

## 2022-07-22 MED ADMIN — SENNOSIDES-DOCUSATE SODIUM 8.6-50 MG PO TAB [40926]: 2 | ORAL | @ 03:00:00 | NDC 00536124810

## 2022-07-22 MED ADMIN — ACETAMINOPHEN 500 MG PO TAB [102]: 1000 mg | ORAL | @ 03:00:00 | Stop: 2022-07-26 | NDC 00904673061

## 2022-07-22 MED ADMIN — POTASSIUM CHLORIDE 20 MEQ PO TBTQ [35943]: 40 meq | ORAL | @ 17:00:00 | Stop: 2023-07-21 | NDC 00832532511

## 2022-07-22 MED ADMIN — POTASSIUM CHLORIDE 20 MEQ PO TBTQ [35943]: 20 meq | ORAL | @ 14:00:00 | NDC 00832532511

## 2022-07-22 MED ADMIN — HEPARIN, PORCINE (PF) 5,000 UNIT/0.5 ML IJ SYRG [95535]: 5000 [IU] | SUBCUTANEOUS | @ 03:00:00 | NDC 00409131611

## 2022-07-22 MED ADMIN — MAGNESIUM OXIDE 400 MG (241.3 MG MAGNESIUM) PO TAB [10491]: 400 mg | ORAL | @ 17:00:00 | NDC 10006070028

## 2022-07-22 MED ADMIN — MAGNESIUM OXIDE 400 MG (241.3 MG MAGNESIUM) PO TAB [10491]: 200 mg | ORAL | @ 12:00:00 | Stop: 2022-08-05 | NDC 10006070028

## 2022-07-22 MED ADMIN — OXYCODONE 5 MG PO TAB [10814]: 5 mg | ORAL | @ 14:00:00 | NDC 00406055223

## 2022-07-22 MED ADMIN — CEFTRIAXONE INJ 1GM IVP [210253]: 2 g | INTRAVENOUS | @ 19:00:00 | NDC 60505614800

## 2022-07-22 MED ADMIN — POTASSIUM CHLORIDE 20 MEQ PO TBTQ [35943]: 40 meq | ORAL | @ 12:00:00 | Stop: 2023-07-21 | NDC 00832532511

## 2022-07-22 MED ADMIN — TORSEMIDE 20 MG PO TAB [18293]: 40 mg | ORAL | @ 22:00:00 | NDC 68084053911

## 2022-07-22 MED ADMIN — SENNOSIDES-DOCUSATE SODIUM 8.6-50 MG PO TAB [40926]: 2 | ORAL | @ 14:00:00 | NDC 00536124810

## 2022-07-22 MED ADMIN — ACETAMINOPHEN 500 MG PO TAB [102]: 1000 mg | ORAL | @ 22:00:00 | Stop: 2022-07-26 | NDC 00904673061

## 2022-07-22 MED ADMIN — ACETAMINOPHEN 500 MG PO TAB [102]: 1000 mg | ORAL | @ 14:00:00 | Stop: 2022-07-26 | NDC 00904673061

## 2022-07-22 MED ADMIN — ALLOPURINOL 300 MG PO TAB [311]: 300 mg | ORAL | @ 14:00:00 | NDC 00904657261

## 2022-07-23 ENCOUNTER — Inpatient Hospital Stay: Admit: 2022-07-23 | Discharge: 2022-07-23 | Payer: BC Managed Care – HMO

## 2022-07-23 ENCOUNTER — Encounter: Admit: 2022-07-23 | Discharge: 2022-07-23 | Payer: BC Managed Care – HMO

## 2022-07-23 DIAGNOSIS — I10 Essential (primary) hypertension: Secondary | ICD-10-CM

## 2022-07-23 DIAGNOSIS — E119 Type 2 diabetes mellitus without complications: Secondary | ICD-10-CM

## 2022-07-23 MED ADMIN — TORSEMIDE 20 MG PO TAB [18293]: 40 mg | ORAL | @ 15:00:00 | NDC 68084053911

## 2022-07-23 MED ADMIN — PANTOPRAZOLE 40 MG PO TBEC [80436]: 40 mg | ORAL | @ 03:00:00 | NDC 00904647461

## 2022-07-23 MED ADMIN — MAGNESIUM OXIDE 400 MG (241.3 MG MAGNESIUM) PO TAB [10491]: 200 mg | ORAL | @ 12:00:00 | Stop: 2022-08-05 | NDC 10006070028

## 2022-07-23 MED ADMIN — ACETAMINOPHEN 500 MG PO TAB [102]: 1000 mg | ORAL | @ 03:00:00 | Stop: 2022-07-26 | NDC 00904673061

## 2022-07-23 MED ADMIN — SENNOSIDES-DOCUSATE SODIUM 8.6-50 MG PO TAB [40926]: 2 | ORAL | @ 15:00:00 | NDC 00536124810

## 2022-07-23 MED ADMIN — METRONIDAZOLE 500 MG PO TAB [5016]: 500 mg | ORAL | @ 03:00:00 | NDC 72578000801

## 2022-07-23 MED ADMIN — OXYCODONE 5 MG PO TAB [10814]: 5 mg | ORAL | @ 15:00:00 | NDC 00406055223

## 2022-07-23 MED ADMIN — CEFTRIAXONE INJ 1GM IVP [210253]: 2 g | INTRAVENOUS | @ 20:00:00 | NDC 60505614800

## 2022-07-23 MED ADMIN — VANCOMYCIN 5 GRAM IV SOLR [8444]: 1750 mg | INTRAVENOUS | @ 17:00:00 | NDC 00409650901

## 2022-07-23 MED ADMIN — CARVEDILOL 6.25 MG PO TAB [77309]: 6.25 mg | ORAL | @ 15:00:00 | NDC 00904630161

## 2022-07-23 MED ADMIN — SENNOSIDES-DOCUSATE SODIUM 8.6-50 MG PO TAB [40926]: 2 | ORAL | @ 03:00:00 | NDC 00536124810

## 2022-07-23 MED ADMIN — TORSEMIDE 20 MG PO TAB [18293]: 40 mg | ORAL | @ 21:00:00 | NDC 68084053911

## 2022-07-23 MED ADMIN — CARVEDILOL 6.25 MG PO TAB [77309]: 6.25 mg | ORAL | @ 03:00:00 | NDC 00904630161

## 2022-07-23 MED ADMIN — METRONIDAZOLE 500 MG PO TAB [5016]: 500 mg | ORAL | @ 15:00:00 | NDC 72578000801

## 2022-07-23 MED ADMIN — ALLOPURINOL 300 MG PO TAB [311]: 300 mg | ORAL | @ 15:00:00 | NDC 00904657261

## 2022-07-23 MED ADMIN — MAGNESIUM OXIDE 400 MG (241.3 MG MAGNESIUM) PO TAB [10491]: 400 mg | ORAL | @ 15:00:00 | NDC 10006070028

## 2022-07-23 MED ADMIN — HEPARIN, PORCINE (PF) 5,000 UNIT/0.5 ML IJ SYRG [95535]: 5000 [IU] | SUBCUTANEOUS | @ 21:00:00 | NDC 00409131611

## 2022-07-23 MED ADMIN — MAGNESIUM OXIDE 400 MG (241.3 MG MAGNESIUM) PO TAB [10491]: 400 mg | ORAL | @ 03:00:00 | NDC 10006070028

## 2022-07-23 MED ADMIN — POTASSIUM CHLORIDE 20 MEQ PO TBTQ [35943]: 40 meq | ORAL | @ 12:00:00 | Stop: 2023-07-21 | NDC 00832532511

## 2022-07-23 MED ADMIN — AMLODIPINE 5 MG PO TAB [79041]: 5 mg | ORAL | @ 15:00:00 | NDC 00904637061

## 2022-07-23 MED ADMIN — HEPARIN, PORCINE (PF) 5,000 UNIT/0.5 ML IJ SYRG [95535]: 5000 [IU] | SUBCUTANEOUS | @ 15:00:00 | NDC 00409131611

## 2022-07-23 MED ADMIN — POTASSIUM CHLORIDE 20 MEQ PO TBTQ [35943]: 20 meq | ORAL | @ 21:00:00 | NDC 00832532511

## 2022-07-23 MED ADMIN — ACETAMINOPHEN 500 MG PO TAB [102]: 1000 mg | ORAL | @ 15:00:00 | Stop: 2022-07-26 | NDC 00904673061

## 2022-07-23 MED ADMIN — HEPARIN, PORCINE (PF) 5,000 UNIT/0.5 ML IJ SYRG [95535]: 5000 [IU] | SUBCUTANEOUS | @ 03:00:00 | NDC 00409131611

## 2022-07-23 MED ADMIN — POLYETHYLENE GLYCOL 3350 17 GRAM PO PWPK [25424]: 17 g | ORAL | @ 03:00:00 | NDC 00904693186

## 2022-07-23 MED ADMIN — GLIPIZIDE 5 MG PO TAB [10117]: 5 mg | ORAL | @ 15:00:00 | NDC 00904663761

## 2022-07-23 MED ADMIN — OXYCODONE 5 MG PO TAB [10814]: 5 mg | ORAL | @ 22:00:00 | NDC 00406055223

## 2022-07-23 MED ADMIN — SODIUM CHLORIDE 0.9 % IV SOLP [27838]: 1750 mg | INTRAVENOUS | @ 17:00:00 | NDC 00338004902

## 2022-07-23 MED ADMIN — POTASSIUM CHLORIDE 20 MEQ PO TBTQ [35943]: 20 meq | ORAL | @ 15:00:00 | NDC 00832532511

## 2022-07-23 MED ADMIN — ACETAMINOPHEN 500 MG PO TAB [102]: 1000 mg | ORAL | @ 21:00:00 | Stop: 2022-07-26 | NDC 00904673061

## 2022-07-23 MED ADMIN — OXYCODONE 5 MG PO TAB [10814]: 5 mg | ORAL | @ 03:00:00 | NDC 68084035411

## 2022-07-23 NOTE — Progress Notes
Benefits relayed to: Jon Pope    Therapy(s) quoted: Ceftriaxone 2g q24h, Vancomycin 1715m q12h  , for a 7 day dispense     Drug Copay ((N2355) Ceftriaxone: $ 107.52  Per Diem/Supply Copay ((D3220): $ 477.47  Drug Copay (J )Vanco: $ 296.45  Per Diem/Supply Copay ((U5427): $ 580.02      Total estimated expense for 7 days of therapy: $1461.45 , until  remaining OOP of $3000 is met. 2024 Benefits have been quoted.

## 2022-07-24 ENCOUNTER — Encounter: Admit: 2022-07-24 | Discharge: 2022-07-24 | Payer: BC Managed Care – HMO

## 2022-07-24 ENCOUNTER — Inpatient Hospital Stay: Admit: 2022-07-24 | Discharge: 2022-07-24 | Payer: BC Managed Care – HMO

## 2022-07-24 DIAGNOSIS — E119 Type 2 diabetes mellitus without complications: Secondary | ICD-10-CM

## 2022-07-24 DIAGNOSIS — I10 Essential (primary) hypertension: Secondary | ICD-10-CM

## 2022-07-24 MED ORDER — MIDAZOLAM 1 MG/ML IJ SOLN
INTRAVENOUS | 0 refills | Status: DC
Start: 2022-07-24 — End: 2022-07-24

## 2022-07-24 MED ORDER — FENTANYL CITRATE (PF) 50 MCG/ML IJ SOLN
INTRAVENOUS | 0 refills | Status: DC
Start: 2022-07-24 — End: 2022-07-24

## 2022-07-24 MED ORDER — PROPOFOL 10 MG/ML IV EMUL 100 ML (INFUSION)(AM)(OR)
INTRAVENOUS | 0 refills | Status: DC
Start: 2022-07-24 — End: 2022-07-24

## 2022-07-24 MED ORDER — PROPOFOL INJ 10 MG/ML IV VIAL
INTRAVENOUS | 0 refills | Status: DC
Start: 2022-07-24 — End: 2022-07-24

## 2022-07-24 MED ADMIN — GLIPIZIDE 5 MG PO TAB [10117]: 5 mg | ORAL | NDC 00904663761

## 2022-07-24 MED ADMIN — ACETAMINOPHEN 500 MG PO TAB [102]: 1000 mg | ORAL | @ 16:00:00 | Stop: 2022-07-26 | NDC 00904673061

## 2022-07-24 MED ADMIN — METRONIDAZOLE 500 MG PO TAB [5016]: 500 mg | ORAL | @ 16:00:00 | NDC 72578000801

## 2022-07-24 MED ADMIN — SODIUM CHLORIDE 0.9 % IV SOLP [27838]: 1750 mg | INTRAVENOUS | @ 04:00:00 | NDC 00338004902

## 2022-07-24 MED ADMIN — PANTOPRAZOLE 40 MG PO TBEC [80436]: 40 mg | ORAL | @ 03:00:00 | NDC 00904647461

## 2022-07-24 MED ADMIN — SODIUM CHLORIDE 0.9 % IV SOLP [27838]: 1000.000 mL | INTRAVENOUS | @ 16:00:00 | Stop: 2022-07-26 | NDC 00338004904

## 2022-07-24 MED ADMIN — SODIUM CHLORIDE 0.9 % IV SOLP [27838]: 1750 mg | INTRAVENOUS | @ 16:00:00 | NDC 00338004902

## 2022-07-24 MED ADMIN — OXYCODONE 5 MG PO TAB [10814]: 5 mg | ORAL | @ 03:00:00 | NDC 00406055223

## 2022-07-24 MED ADMIN — POTASSIUM CHLORIDE IN WATER 10 MEQ/50 ML IV PGBK [11075]: 10 meq | INTRAVENOUS | @ 14:00:00 | Stop: 2023-07-21 | NDC 00338070541

## 2022-07-24 MED ADMIN — CARVEDILOL 6.25 MG PO TAB [77309]: 6.25 mg | ORAL | @ 03:00:00 | NDC 00904630161

## 2022-07-24 MED ADMIN — AMLODIPINE 5 MG PO TAB [79041]: 5 mg | ORAL | @ 16:00:00 | NDC 00904637061

## 2022-07-24 MED ADMIN — VANCOMYCIN 5 GRAM IV SOLR [8444]: 1750 mg | INTRAVENOUS | @ 04:00:00 | NDC 00409650901

## 2022-07-24 MED ADMIN — POTASSIUM CHLORIDE 20 MEQ PO TBTQ [35943]: 20 meq | ORAL | @ 21:00:00 | NDC 00832532511

## 2022-07-24 MED ADMIN — ACETAMINOPHEN 500 MG PO TAB [102]: 1000 mg | ORAL | @ 03:00:00 | Stop: 2022-07-26 | NDC 00904673061

## 2022-07-24 MED ADMIN — SENNOSIDES-DOCUSATE SODIUM 8.6-50 MG PO TAB [40926]: 2 | ORAL | @ 03:00:00 | NDC 00536124810

## 2022-07-24 MED ADMIN — POTASSIUM CHLORIDE IN WATER 10 MEQ/50 ML IV PGBK [11075]: 10 meq | INTRAVENOUS | @ 12:00:00 | Stop: 2023-07-21 | NDC 00338070541

## 2022-07-24 MED ADMIN — VANCOMYCIN 5 GRAM IV SOLR [8444]: 1750 mg | INTRAVENOUS | @ 16:00:00 | NDC 00409650901

## 2022-07-24 MED ADMIN — HEPARIN, PORCINE (PF) 5,000 UNIT/0.5 ML IJ SYRG [95535]: 5000 [IU] | SUBCUTANEOUS | @ 03:00:00 | NDC 00409131611

## 2022-07-24 MED ADMIN — ACETAMINOPHEN 500 MG PO TAB [102]: 1000 mg | ORAL | @ 21:00:00 | Stop: 2022-07-26 | NDC 00904673061

## 2022-07-24 MED ADMIN — CEFTRIAXONE INJ 1GM IVP [210253]: 2 g | INTRAVENOUS | @ 20:00:00 | Stop: 2022-07-24 | NDC 60505614800

## 2022-07-24 MED ADMIN — OXYCODONE 5 MG PO TAB [10814]: 5 mg | ORAL | @ 21:00:00 | NDC 00406055223

## 2022-07-24 MED ADMIN — CARVEDILOL 6.25 MG PO TAB [77309]: 6.25 mg | ORAL | @ 16:00:00 | NDC 00904630161

## 2022-07-24 MED ADMIN — METRONIDAZOLE 500 MG PO TAB [5016]: 500 mg | ORAL | @ 03:00:00 | NDC 72578000801

## 2022-07-24 MED ADMIN — POLYETHYLENE GLYCOL 3350 17 GRAM PO PWPK [25424]: 17 g | ORAL | @ 03:00:00 | NDC 00904693186

## 2022-07-24 MED ADMIN — HEPARIN, PORCINE (PF) 5,000 UNIT/0.5 ML IJ SYRG [95535]: 5000 [IU] | SUBCUTANEOUS | @ 16:00:00 | NDC 00409131611

## 2022-07-24 MED ADMIN — MAGNESIUM OXIDE 400 MG (241.3 MG MAGNESIUM) PO TAB [10491]: 400 mg | ORAL | @ 03:00:00 | NDC 10006070028

## 2022-07-25 ENCOUNTER — Inpatient Hospital Stay: Admit: 2022-07-25 | Discharge: 2022-07-25 | Payer: BC Managed Care – HMO

## 2022-07-25 MED ADMIN — OXYCODONE 5 MG PO TAB [10814]: 5 mg | ORAL | @ 06:00:00 | NDC 00406055223

## 2022-07-25 MED ADMIN — POTASSIUM CHLORIDE 20 MEQ PO TBTQ [35943]: 20 meq | ORAL | @ 15:00:00 | NDC 00832532511

## 2022-07-25 MED ADMIN — GLIPIZIDE 5 MG PO TAB [10117]: 5 mg | ORAL | @ 22:00:00 | NDC 00904663761

## 2022-07-25 MED ADMIN — METRONIDAZOLE 500 MG PO TAB [5016]: 500 mg | ORAL | @ 15:00:00 | NDC 72578000801

## 2022-07-25 MED ADMIN — MAGNESIUM OXIDE 400 MG (241.3 MG MAGNESIUM) PO TAB [10491]: 400 mg | ORAL | @ 15:00:00 | NDC 10006070028

## 2022-07-25 MED ADMIN — VANCOMYCIN 5 GRAM IV SOLR [8444]: 1750 mg | INTRAVENOUS | @ 17:00:00 | NDC 00409650901

## 2022-07-25 MED ADMIN — ACETAMINOPHEN 500 MG PO TAB [102]: 1000 mg | ORAL | @ 22:00:00 | Stop: 2022-07-26 | NDC 00904673061

## 2022-07-25 MED ADMIN — HEPARIN, PORCINE (PF) 5,000 UNIT/0.5 ML IJ SYRG [95535]: 5000 [IU] | SUBCUTANEOUS | @ 15:00:00 | NDC 00409131611

## 2022-07-25 MED ADMIN — POTASSIUM CHLORIDE 20 MEQ PO TBTQ [35943]: 40 meq | ORAL | @ 13:00:00 | Stop: 2023-07-21 | NDC 00832532511

## 2022-07-25 MED ADMIN — TORSEMIDE 20 MG PO TAB [18293]: 40 mg | ORAL | @ 22:00:00 | NDC 68084053911

## 2022-07-25 MED ADMIN — HEPARIN, PORCINE (PF) 5,000 UNIT/0.5 ML IJ SYRG [95535]: 5000 [IU] | SUBCUTANEOUS | @ 03:00:00 | NDC 00409131611

## 2022-07-25 MED ADMIN — SODIUM CHLORIDE 0.9 % IV SOLP [27838]: 1750 mg | INTRAVENOUS | @ 04:00:00 | NDC 00338004902

## 2022-07-25 MED ADMIN — PANTOPRAZOLE 40 MG PO TBEC [80436]: 40 mg | ORAL | @ 03:00:00 | NDC 00904647461

## 2022-07-25 MED ADMIN — METRONIDAZOLE 500 MG PO TAB [5016]: 500 mg | ORAL | @ 03:00:00 | NDC 72578000801

## 2022-07-25 MED ADMIN — VANCOMYCIN 5 GRAM IV SOLR [8444]: 1750 mg | INTRAVENOUS | @ 04:00:00 | NDC 00409650901

## 2022-07-25 MED ADMIN — MAGNESIUM OXIDE 400 MG (241.3 MG MAGNESIUM) PO TAB [10491]: 400 mg | ORAL | @ 03:00:00 | NDC 10006070028

## 2022-07-25 MED ADMIN — AMLODIPINE 5 MG PO TAB [79041]: 5 mg | ORAL | @ 15:00:00 | NDC 00904637061

## 2022-07-25 MED ADMIN — TORSEMIDE 20 MG PO TAB [18293]: 40 mg | ORAL | @ 15:00:00 | NDC 68084053911

## 2022-07-25 MED ADMIN — INSULIN GLARGINE 100 UNIT/ML (3 ML) SC INJ PEN [163596]: 40 [IU] | SUBCUTANEOUS | @ 15:00:00 | NDC 00088221905

## 2022-07-25 MED ADMIN — ALLOPURINOL 300 MG PO TAB [311]: 300 mg | ORAL | @ 15:00:00 | NDC 00904657261

## 2022-07-25 MED ADMIN — CARVEDILOL 6.25 MG PO TAB [77309]: 6.25 mg | ORAL | @ 17:00:00 | NDC 00904630161

## 2022-07-25 MED ADMIN — CARVEDILOL 6.25 MG PO TAB [77309]: 6.25 mg | ORAL | @ 03:00:00 | NDC 00904630161

## 2022-07-25 MED ADMIN — GLIPIZIDE 5 MG PO TAB [10117]: 5 mg | ORAL | @ 15:00:00 | NDC 00904663761

## 2022-07-25 MED ADMIN — SODIUM CHLORIDE 0.9 % IV SOLP [27838]: 1750 mg | INTRAVENOUS | @ 17:00:00 | NDC 00338004902

## 2022-07-25 MED ADMIN — POTASSIUM CHLORIDE 20 MEQ PO TBTQ [35943]: 40 meq | ORAL | @ 17:00:00 | Stop: 2023-07-21 | NDC 00832532511

## 2022-07-25 MED ADMIN — ACETAMINOPHEN 500 MG PO TAB [102]: 1000 mg | ORAL | @ 15:00:00 | Stop: 2022-07-26 | NDC 00904673061

## 2022-07-25 MED ADMIN — POTASSIUM CHLORIDE 20 MEQ PO TBTQ [35943]: 60 meq | ORAL | @ 12:00:00 | Stop: 2022-07-25 | NDC 00832532511

## 2022-07-25 MED ADMIN — HEPARIN, PORCINE (PF) 5,000 UNIT/0.5 ML IJ SYRG [95535]: 5000 [IU] | SUBCUTANEOUS | @ 22:00:00 | NDC 00409131611

## 2022-07-25 MED ADMIN — POTASSIUM CHLORIDE 20 MEQ PO TBTQ [35943]: 20 meq | ORAL | @ 22:00:00 | NDC 00832532511

## 2022-07-25 MED ADMIN — ACETAMINOPHEN 500 MG PO TAB [102]: 1000 mg | ORAL | @ 03:00:00 | Stop: 2022-07-26 | NDC 00904673061

## 2022-07-26 MED ADMIN — HEPARIN, PORCINE (PF) 5,000 UNIT/0.5 ML IJ SYRG [95535]: 5000 [IU] | SUBCUTANEOUS | @ 22:00:00 | NDC 00409131611

## 2022-07-26 MED ADMIN — SODIUM CHLORIDE 0.9 % IV SOLP [27838]: 1750 mg | INTRAVENOUS | @ 03:00:00 | NDC 00338004902

## 2022-07-26 MED ADMIN — HEPARIN, PORCINE (PF) 5,000 UNIT/0.5 ML IJ SYRG [95535]: 5000 [IU] | SUBCUTANEOUS | @ 03:00:00 | NDC 00409131611

## 2022-07-26 MED ADMIN — MAGNESIUM OXIDE 400 MG (241.3 MG MAGNESIUM) PO TAB [10491]: 400 mg | ORAL | @ 03:00:00 | NDC 10006070028

## 2022-07-26 MED ADMIN — VANCOMYCIN 5 GRAM IV SOLR [8444]: 1750 mg | INTRAVENOUS | @ 17:00:00 | NDC 00409650901

## 2022-07-26 MED ADMIN — GLIPIZIDE 5 MG PO TAB [10117]: 5 mg | ORAL | @ 14:00:00 | NDC 00904663761

## 2022-07-26 MED ADMIN — POTASSIUM CHLORIDE 20 MEQ PO TBTQ [35943]: 60 meq | ORAL | @ 14:00:00 | NDC 00832532511

## 2022-07-26 MED ADMIN — SODIUM CHLORIDE 0.9 % FLUSH [210767]: 10 mL | @ 14:00:00 | NDC 08290306546

## 2022-07-26 MED ADMIN — ACETAMINOPHEN 500 MG PO TAB [102]: 1000 mg | ORAL | @ 03:00:00 | Stop: 2022-07-26 | NDC 00904673061

## 2022-07-26 MED ADMIN — VANCOMYCIN 5 GRAM IV SOLR [8444]: 1750 mg | INTRAVENOUS | @ 03:00:00 | NDC 00409650901

## 2022-07-26 MED ADMIN — PANTOPRAZOLE 40 MG PO TBEC [80436]: 40 mg | ORAL | @ 03:00:00 | NDC 00904647461

## 2022-07-26 MED ADMIN — MAGNESIUM OXIDE 400 MG (241.3 MG MAGNESIUM) PO TAB [10491]: 400 mg | ORAL | @ 14:00:00 | NDC 10006070028

## 2022-07-26 MED ADMIN — METRONIDAZOLE 500 MG PO TAB [5016]: 500 mg | ORAL | @ 14:00:00 | NDC 72578000801

## 2022-07-26 MED ADMIN — AMLODIPINE 5 MG PO TAB [79041]: 5 mg | ORAL | @ 14:00:00 | NDC 00904637061

## 2022-07-26 MED ADMIN — SODIUM CHLORIDE 0.9 % IV SOLP [27838]: 1750 mg | INTRAVENOUS | @ 17:00:00 | NDC 00338004902

## 2022-07-26 MED ADMIN — METRONIDAZOLE 500 MG PO TAB [5016]: 500 mg | ORAL | @ 03:00:00 | NDC 72578000801

## 2022-07-26 MED ADMIN — CARVEDILOL 6.25 MG PO TAB [77309]: 6.25 mg | ORAL | @ 14:00:00 | NDC 00904630161

## 2022-07-26 MED ADMIN — TORSEMIDE 20 MG PO TAB [18293]: 40 mg | ORAL | @ 14:00:00 | NDC 68084053911

## 2022-07-26 MED ADMIN — TORSEMIDE 20 MG PO TAB [18293]: 40 mg | ORAL | @ 22:00:00 | NDC 68084053911

## 2022-07-26 MED ADMIN — HEPARIN, PORCINE (PF) 5,000 UNIT/0.5 ML IJ SYRG [95535]: 5000 [IU] | SUBCUTANEOUS | @ 14:00:00 | NDC 00409131611

## 2022-07-26 MED ADMIN — ACETAMINOPHEN 500 MG PO TAB [102]: 1000 mg | ORAL | @ 10:00:00 | Stop: 2022-07-26 | NDC 00904673061

## 2022-07-26 MED ADMIN — CARVEDILOL 6.25 MG PO TAB [77309]: 6.25 mg | ORAL | @ 05:00:00 | NDC 00904630161

## 2022-07-26 MED ADMIN — POTASSIUM CHLORIDE IN WATER 10 MEQ/50 ML IV PGBK [11075]: 10 meq | INTRAVENOUS | @ 13:00:00 | Stop: 2022-07-26 | NDC 00338070541

## 2022-07-26 MED ADMIN — POTASSIUM CHLORIDE 20 MEQ PO TBTQ [35943]: 60 meq | ORAL | @ 22:00:00 | NDC 00832532511

## 2022-07-26 MED ADMIN — ALLOPURINOL 300 MG PO TAB [311]: 300 mg | ORAL | @ 14:00:00 | NDC 00904657261

## 2022-07-27 MED ADMIN — GLIPIZIDE 5 MG PO TAB [10117]: 5 mg | ORAL | @ 14:00:00 | Stop: 2022-07-27 | NDC 00904663761

## 2022-07-27 MED ADMIN — VANCOMYCIN 5 GRAM IV SOLR [8444]: 1750 mg | INTRAVENOUS | @ 16:00:00 | NDC 00409650901

## 2022-07-27 MED ADMIN — POTASSIUM CHLORIDE 20 MEQ PO TBTQ [35943]: 60 meq | ORAL | @ 14:00:00 | NDC 00832532511

## 2022-07-27 MED ADMIN — ACETAMINOPHEN 500 MG PO TAB [102]: 1000 mg | ORAL | @ 14:00:00 | Stop: 2022-07-27 | NDC 00904673061

## 2022-07-27 MED ADMIN — GLIPIZIDE 5 MG PO TAB [10117]: 5 mg | ORAL | NDC 00904663761

## 2022-07-27 MED ADMIN — TORSEMIDE 20 MG PO TAB [18293]: 40 mg | ORAL | @ 21:00:00 | NDC 68084053911

## 2022-07-27 MED ADMIN — METRONIDAZOLE 500 MG PO TAB [5016]: 500 mg | ORAL | @ 02:00:00 | NDC 72578000801

## 2022-07-27 MED ADMIN — HEPARIN, PORCINE (PF) 5,000 UNIT/0.5 ML IJ SYRG [95535]: 5000 [IU] | SUBCUTANEOUS | @ 21:00:00 | NDC 00409131611

## 2022-07-27 MED ADMIN — TORSEMIDE 20 MG PO TAB [18293]: 40 mg | ORAL | @ 14:00:00 | NDC 68084053911

## 2022-07-27 MED ADMIN — SODIUM CHLORIDE 0.9 % IV SOLP [27838]: 1750 mg | INTRAVENOUS | @ 04:00:00 | NDC 00338004902

## 2022-07-27 MED ADMIN — HEPARIN, PORCINE (PF) 5,000 UNIT/0.5 ML IJ SYRG [95535]: 5000 [IU] | SUBCUTANEOUS | @ 14:00:00 | NDC 00409131611

## 2022-07-27 MED ADMIN — SODIUM CHLORIDE 0.9 % IV SOLP [27838]: 1750 mg | INTRAVENOUS | @ 16:00:00 | NDC 00338004902

## 2022-07-27 MED ADMIN — ALLOPURINOL 300 MG PO TAB [311]: 300 mg | ORAL | @ 14:00:00 | NDC 00904657261

## 2022-07-27 MED ADMIN — POTASSIUM CHLORIDE 20 MEQ PO TBTQ [35943]: 60 meq | ORAL | @ 21:00:00 | NDC 00832532511

## 2022-07-27 MED ADMIN — MAGNESIUM OXIDE 400 MG (241.3 MG MAGNESIUM) PO TAB [10491]: 400 mg | ORAL | @ 14:00:00 | NDC 10006070028

## 2022-07-27 MED ADMIN — CARVEDILOL 6.25 MG PO TAB [77309]: 6.25 mg | ORAL | @ 14:00:00 | NDC 00904630161

## 2022-07-27 MED ADMIN — METRONIDAZOLE 500 MG PO TAB [5016]: 500 mg | ORAL | @ 14:00:00 | NDC 72578000801

## 2022-07-27 MED ADMIN — CARVEDILOL 6.25 MG PO TAB [77309]: 6.25 mg | ORAL | @ 02:00:00 | NDC 00904630161

## 2022-07-27 MED ADMIN — MAGNESIUM OXIDE 400 MG (241.3 MG MAGNESIUM) PO TAB [10491]: 400 mg | ORAL | @ 02:00:00 | NDC 10006070028

## 2022-07-27 MED ADMIN — OXYCODONE 10 MG PO TAB [166908]: 10 mg | ORAL | @ 14:00:00 | Stop: 2022-07-27 | NDC 68084096811

## 2022-07-27 MED ADMIN — SODIUM CHLORIDE 0.9 % FLUSH [210767]: 10 mL | @ 14:00:00 | NDC 08290306546

## 2022-07-27 MED ADMIN — AMLODIPINE 5 MG PO TAB [79041]: 5 mg | ORAL | @ 14:00:00 | NDC 00904637061

## 2022-07-27 MED ADMIN — POTASSIUM CHLORIDE 20 MEQ PO TBTQ [35943]: 20 meq | ORAL | @ 16:00:00 | Stop: 2023-07-21 | NDC 00832532511

## 2022-07-27 MED ADMIN — HEPARIN, PORCINE (PF) 5,000 UNIT/0.5 ML IJ SYRG [95535]: 5000 [IU] | SUBCUTANEOUS | @ 02:00:00 | NDC 00409131611

## 2022-07-27 MED ADMIN — PANTOPRAZOLE 40 MG PO TBEC [80436]: 40 mg | ORAL | @ 02:00:00 | NDC 00904647461

## 2022-07-27 MED ADMIN — VANCOMYCIN 5 GRAM IV SOLR [8444]: 1750 mg | INTRAVENOUS | @ 04:00:00 | NDC 00409650901

## 2022-07-28 ENCOUNTER — Encounter: Admit: 2022-07-28 | Discharge: 2022-07-28 | Payer: BC Managed Care – HMO

## 2022-07-28 DIAGNOSIS — M869 Osteomyelitis, unspecified: Secondary | ICD-10-CM

## 2022-07-28 DIAGNOSIS — I10 Essential (primary) hypertension: Secondary | ICD-10-CM

## 2022-07-28 DIAGNOSIS — E119 Type 2 diabetes mellitus without complications: Secondary | ICD-10-CM

## 2022-07-28 MED ADMIN — POTASSIUM CHLORIDE 20 MEQ PO TBTQ [35943]: 60 meq | ORAL | @ 21:00:00 | Stop: 2022-07-29 | NDC 00832532511

## 2022-07-28 MED ADMIN — MAGNESIUM OXIDE 400 MG (241.3 MG MAGNESIUM) PO TAB [10491]: 400 mg | ORAL | @ 04:00:00 | NDC 10006070028

## 2022-07-28 MED ADMIN — INSULIN ASPART 100 UNIT/ML SC FLEXPEN [87504]: 2 [IU] | SUBCUTANEOUS | @ 18:00:00 | Stop: 2022-07-29 | NDC 00169633910

## 2022-07-28 MED ADMIN — SODIUM CHLORIDE 0.9 % IV SOLP [27838]: 1750 mg | INTRAVENOUS | @ 17:00:00 | Stop: 2022-07-29 | NDC 00338004902

## 2022-07-28 MED ADMIN — AMLODIPINE 5 MG PO TAB [79041]: 5 mg | ORAL | @ 15:00:00 | Stop: 2022-07-29 | NDC 00904637061

## 2022-07-28 MED ADMIN — CARVEDILOL 6.25 MG PO TAB [77309]: 6.25 mg | ORAL | @ 15:00:00 | Stop: 2022-07-29 | NDC 00904630161

## 2022-07-28 MED ADMIN — VANCOMYCIN 5 GRAM IV SOLR [8444]: 1750 mg | INTRAVENOUS | @ 17:00:00 | Stop: 2022-07-29 | NDC 00409650901

## 2022-07-28 MED ADMIN — METRONIDAZOLE 500 MG PO TAB [5016]: 500 mg | ORAL | @ 04:00:00 | NDC 72578000801

## 2022-07-28 MED ADMIN — SODIUM CHLORIDE 0.9 % FLUSH [210767]: 10 mL | @ 15:00:00 | Stop: 2022-07-29 | NDC 08290306546

## 2022-07-28 MED ADMIN — METRONIDAZOLE 500 MG PO TAB [5016]: 500 mg | ORAL | @ 15:00:00 | Stop: 2022-07-29 | NDC 72578000801

## 2022-07-28 MED ADMIN — POTASSIUM CHLORIDE 20 MEQ PO TBTQ [35943]: 60 meq | ORAL | @ 15:00:00 | Stop: 2022-07-29 | NDC 00832532511

## 2022-07-28 MED ADMIN — INSULIN GLARGINE 100 UNIT/ML (3 ML) SC INJ PEN [163596]: 60 [IU] | SUBCUTANEOUS | @ 15:00:00 | Stop: 2022-07-29 | NDC 00088221905

## 2022-07-28 MED ADMIN — ALLOPURINOL 300 MG PO TAB [311]: 300 mg | ORAL | @ 15:00:00 | Stop: 2022-07-29 | NDC 00904657261

## 2022-07-28 MED ADMIN — PANTOPRAZOLE 40 MG PO TBEC [80436]: 40 mg | ORAL | @ 04:00:00 | NDC 00904647461

## 2022-07-28 MED ADMIN — VANCOMYCIN 5 GRAM IV SOLR [8444]: 1750 mg | INTRAVENOUS | @ 04:00:00 | NDC 00409650901

## 2022-07-28 MED ADMIN — TORSEMIDE 20 MG PO TAB [18293]: 40 mg | ORAL | @ 15:00:00 | Stop: 2022-07-29 | NDC 68084053911

## 2022-07-28 MED ADMIN — TORSEMIDE 20 MG PO TAB [18293]: 40 mg | ORAL | @ 21:00:00 | Stop: 2022-07-29 | NDC 68084053911

## 2022-07-28 MED ADMIN — HEPARIN, PORCINE (PF) 5,000 UNIT/0.5 ML IJ SYRG [95535]: 5000 [IU] | SUBCUTANEOUS | @ 04:00:00 | NDC 00409131611

## 2022-07-28 MED ADMIN — CARVEDILOL 6.25 MG PO TAB [77309]: 6.25 mg | ORAL | @ 04:00:00 | NDC 00904630161

## 2022-07-28 MED ADMIN — SODIUM CHLORIDE 0.9 % IV SOLP [27838]: 1750 mg | INTRAVENOUS | @ 04:00:00 | NDC 00338004902

## 2022-07-28 MED ADMIN — MAGNESIUM OXIDE 400 MG (241.3 MG MAGNESIUM) PO TAB [10491]: 400 mg | ORAL | @ 15:00:00 | Stop: 2022-07-29 | NDC 10006070028

## 2022-07-28 MED ADMIN — HEPARIN, PORCINE (PF) 5,000 UNIT/0.5 ML IJ SYRG [95535]: 5000 [IU] | SUBCUTANEOUS | @ 15:00:00 | Stop: 2022-07-29 | NDC 00409131611

## 2022-07-28 MED ADMIN — POTASSIUM CHLORIDE 20 MEQ PO TBTQ [35943]: 40 meq | ORAL | @ 13:00:00 | Stop: 2022-07-29 | NDC 00832532511

## 2022-07-28 MED FILL — INSULIN DETEMIR 100 UNIT/ML SC FLEXTOUCH: 100 unit/mL (3 mL) | SUBCUTANEOUS | 25 days supply | Qty: 15 | Fill #1 | Status: CP

## 2022-07-28 MED FILL — METRONIDAZOLE 500 MG PO TAB: 500 mg | ORAL | 35 days supply | Qty: 70 | Fill #1 | Status: CP

## 2022-07-28 MED FILL — POTASSIUM CHLORIDE 20 MEQ PO TBTQ: 20 mEq | ORAL | 30 days supply | Qty: 180 | Fill #1 | Status: CP

## 2022-07-28 MED FILL — OXYCODONE 5 MG PO TAB: 5 mg | ORAL | 10 days supply | Qty: 30 | Fill #1 | Status: CP

## 2022-07-28 NOTE — Progress Notes
Benefits relayed to: Jon Pope    Therapy(s) quoted: Vancomycin 1737m q12h  , for a 7 day dispense     Drug Copay ((Y7829): $ 296.45  Per Diem/Supply Copay ((F6213): $ 580.02      Total estimated expense for 7 days of therapy: $876.47 , until remaining deductible of $1300 is met

## 2022-07-29 ENCOUNTER — Encounter: Admit: 2022-07-29 | Discharge: 2022-07-29 | Payer: BC Managed Care – HMO

## 2022-07-29 DIAGNOSIS — Z792 Long term (current) use of antibiotics: Secondary | ICD-10-CM

## 2022-07-29 DIAGNOSIS — M86119 Other acute osteomyelitis, unspecified shoulder: Secondary | ICD-10-CM

## 2022-07-29 NOTE — Telephone Encounter
ACTIVE OPAT: 07/29/22  Hosp D/C Date: 07/28/22  ID Dr. Fredna Dow  Next ID f/u: ~2-3 weeks     Diagnosis: Clavicular osteomyelitis    1. Vancomycin 1,750mg  IV Q 12hrs x 6 weeks  S: 07/21/22  E: 09/01/22    2. Metronidazole 500mg  PO BID x 6 weeks  S: 07/21/22  E: 09/01/22    3. Lab Orders: Weekly CBC w/diff, CMP, CRP, Vancomycin trough on Wednesdays starting 07/29/22. Please fax results to Dr. Fredna Dow at [phone].     4. Weekly PICC care per facility protocol.   Line: Paulina Fusi PICC      Services:  [facility]  P: [phone]  F: [place], PharmD    [facility]  P: [phone]  F: [phone]        Verified correct medication orders with note by Lorraine Lax, Pharm D at [facility].     Confirmed Metronidazole delivered from [facility] on 07/28/22.    Verified correct labs/line care orders with Abby at [facility]

## 2022-07-30 ENCOUNTER — Encounter: Admit: 2022-07-30 | Discharge: 2022-07-30 | Payer: BC Managed Care – HMO

## 2022-07-30 DIAGNOSIS — Z792 Long term (current) use of antibiotics: Secondary | ICD-10-CM

## 2022-07-30 DIAGNOSIS — M86119 Other acute osteomyelitis, unspecified shoulder: Secondary | ICD-10-CM

## 2022-07-30 LAB — CBC AND DIFF
HEMOGLOBIN: 14
PLATELET COUNT: 161
WBC COUNT: 5.6

## 2022-07-30 LAB — VANCOMYCIN TROUGH: VANCOMYCIN TROUGH: 14

## 2022-07-30 LAB — COMPREHENSIVE METABOLIC PANEL

## 2022-07-31 ENCOUNTER — Encounter: Admit: 2022-07-31 | Discharge: 2022-07-31 | Payer: BC Managed Care – HMO

## 2022-07-31 NOTE — Telephone Encounter
Received call from [facility] with questions regarding wound vac dressing.  HHRN is reporting serious undermining in the wound and is wanting orders to place Adaptic and two pieces of foam in the wound bed to help with the tunneling area.  Based on chart review, prior measurements of the wound were 8.5x3x6 cm on 12/26 and  7x3x3.5cm on 12/28 without tunneling.  Confirmed with Dr. Ernestina Patches who evaluated wound on 1/1 that tunneling was not noted with last wound vac change.  Dr. Leeann Must contacted [facility] to further discuss. She reports that wound has 2.8 cm tunneling area in the center of the wound bed. Dr. Leeann Must recommended that dressing continue to be changed M/W/F with one piece of foam in the incision, as previously done inpatient.  Dr. Leeann Must would like for patient to report to clinic on 1/8 for next wound vac change to further evaluate wound and determine if any changes are needed to current wound care.  Fortunato Curling, RN

## 2022-08-03 ENCOUNTER — Encounter: Admit: 2022-08-03 | Discharge: 2022-08-03 | Payer: BC Managed Care – HMO

## 2022-08-03 ENCOUNTER — Ambulatory Visit: Admit: 2022-08-03 | Discharge: 2022-08-03 | Payer: BC Managed Care – HMO

## 2022-08-03 DIAGNOSIS — M869 Osteomyelitis, unspecified: Secondary | ICD-10-CM

## 2022-08-05 ENCOUNTER — Encounter: Admit: 2022-08-05 | Discharge: 2022-08-05 | Payer: BC Managed Care – HMO

## 2022-08-05 NOTE — Telephone Encounter
Received call from patient's [facility] RN saying that patient's PICC line did not draw back blood but was able to flush. This RN asked if she attempted a venipuncture and she said that she did not. She is able to go back and see patient tomorrow and will attempt to get labs then.     Discussed with Cherrie Distance and he completed benefit check for cathflo in the home, it will cost patient $120. This RN called and spoke with patient and he reports that he is okay with paying this and is agreeable to having [place] back out tomorrow.

## 2022-08-06 ENCOUNTER — Encounter: Admit: 2022-08-06 | Discharge: 2022-08-06 | Payer: BC Managed Care – HMO

## 2022-08-06 DIAGNOSIS — Z792 Long term (current) use of antibiotics: Secondary | ICD-10-CM

## 2022-08-06 DIAGNOSIS — M86119 Other acute osteomyelitis, unspecified shoulder: Secondary | ICD-10-CM

## 2022-08-06 LAB — CBC AND DIFF
HEMOGLOBIN: 14
WBC COUNT: 6.5

## 2022-08-06 LAB — COMPREHENSIVE METABOLIC PANEL

## 2022-08-06 NOTE — Telephone Encounter
Labs received and reviewed, per Dr. Fredna Dow, patient is to hold tonight's dose of vancomycin and restart tomorrow at 1500mg  q12hrs. Vanc trough to be checked on Sunday if possible or Monday.    Verbal order given to Scott Rph. Also called patient's [place] and gave orders, she requested orders be faxed as well.    Attempted to contact patient to update him but was unable to reach him. Per Nicki Reaper, patient called [facility] and he gave patient instructions to hold dose for tonight and restart tomorrow with the new dose.     Infectious Disease Outpatient Orders:    Today's Date: 08/06/22    Per Dr. Fredna Dow    DX: Clavicular osteomyelitis     1.Patient to hold second Vancomycin dose on 08/06/22.    2. BEGIN Vancomycin 1500mg  IV q12hrs on 08/07/22.    3. CONTINUE to draw labs weekly: CBC w/diff, CMP, CRP, and Vancomycin trough every Monday. If possible, draw Vancomycin trough only on 08/09/22.    4. Please draw labs from picc/central line. If unable to draw back blood from line then do a peripheral stick to obtain labs. Also call ID RN to report picc/central line is not drawing back blood.     5. Please Fax All Lab Results to Dr. Fredna Dow at [phone].    6. Please monitor and maintain central line per agency's protocol. Please change dressing in sterile fashion completely including Biopatch, extension tubing and adapters Every 7 days and as needed. ([facility] to provide Biopatch and picc dressing supplies.)     7. Okay to administer alteplase as instructed by infusion pharmacy PRN if PICC line is not drawing back blood.    Thank you,         Please Address Questions, Abnormal or Critical Lab Results:    Call Joni Reining RN at [phone]. Monday thru Friday 8am - 4pm      After 4pm, On the Weekends, or on a Holiday: Page the ID Fellow On Call at [phone]

## 2022-08-11 ENCOUNTER — Encounter: Admit: 2022-08-11 | Discharge: 2022-08-11 | Payer: BC Managed Care – HMO

## 2022-08-11 ENCOUNTER — Ambulatory Visit: Admit: 2022-08-11 | Discharge: 2022-08-11 | Payer: BC Managed Care – HMO

## 2022-08-11 DIAGNOSIS — M869 Osteomyelitis, unspecified: Secondary | ICD-10-CM

## 2022-08-11 DIAGNOSIS — I10 Essential (primary) hypertension: Secondary | ICD-10-CM

## 2022-08-11 DIAGNOSIS — E119 Type 2 diabetes mellitus without complications: Secondary | ICD-10-CM

## 2022-08-11 NOTE — Progress Notes
History of Present Illness:     62 y/o with a history of DM2, Streptococcal bacteremia secondary to necrotizing skin/soft tissue infection of the neck who presents for follow up sternoclavicular osteomyelitis.     Pertinent Hx:   In 02/2022 Pt was transferred to [facility] from [facility], [place]) for management of R sided neck infection and Group C Streptococcus bacteremia. CT neck  (03/09/22) showed mild skin thickening with inflammatory stranding and muscle edema of R side of neck. He had MRI C spine  (03/13/22) which showed infection w/i the R carotid space and R side of the neck with extension into preverterbal soft tissuee and mediastinum. CT chest (03/13/22) showed phlegmon involving R side of the neck encasing manubrium and extending into anterior mediastinum and R lung apex. He was subsequently transferred to [place] on 03/14/22. At [facility] repeat CT chest and neck (03/16/22) showed progression of infection with development of sternoclavicular abscess. He was taken to OR on 8/22 for debridement (ENT and CTS), purulence extended ~7 cm into prepleural space, wound vac was placed. He was taken back to OR on 8/24 and no additional purulence or necrotic tissue noted. S/p extraction of multiple teeth (8/25). He received IV antibiotics while hospitalized and was transitioned to Augmentin on discharge. He was continued on PO antibiotics until middle of 03/2022. At that time antibiotics were stopped as he was doing well and he had completed nearly 4 weeks of antibiotics.    His wound of his R clavicle never fully healed and he continued to have intermittent drainage. In 05/2022 he had CT at OSH facility that showed erosive changes around clavicle with small cutaneous fistula tract. He follows up with thoracic surgery and was admitted on 07/21/22 and underwent debridement with resection of R medial clavicle. Operative cultures grew Corynebacterium, Staphylococcus epidermidis and Cutibacterium. He was discharged on IV Vancomycin and metronidazole.    Today's visit (08/11/22)  States he is doing okay. Notes he had some issues with his PICC line since discharge. States at first he had some issues with bleeding but that was able to be controlled. However later last week were unable to draw labs from his line. He states they since have been able to get it working again. He notes some burning pain around his wound vac. He denies fevers, chills, nausea or vomiting.          Review of systems:  ROS as above      Allergies:    Allergies   Allergen Reactions    Sulfur ITCHING         acetaminophen (TYLENOL EXTRA STRENGTH) 500 mg tablet Take two tablets by mouth every 6 hours as needed. Max of 4,000 mg of acetaminophen in 24 hours.    allopurinoL (ZYLOPRIM) 300 mg tablet Take one tablet by mouth daily. Take with food.    amLODIPine (NORVASC) 5 mg tablet Take one tablet by mouth daily.    blood sugar diagnostic (GLUCOCARD EXPRESSION) test strip Use one strip as directed three times daily before meals. ICD-10: Type 2 diabetes with hyperglycemia, insulin use E11.65, Z79.4    blood-glucose meter kit ICD-10: Type 2 diabetes with hyperglycemia, insulin use E11.65, Z79.4    carvediloL (COREG) 6.25 mg tablet Take one tablet by mouth twice daily. Take with food.    glucose (DEX4 GLUCOSE) 4 gram chewable tablet Chew four tablets by mouth as Needed.    insulin aspart (U-100) (NOVOLOG FLEXPEN U-100 INSULIN) 100 unit/mL (3 mL) PEN Inject twelve Units under  the skin three times daily with meals.    insulin detemir U-100 (LEVEMIR FLEXTOUCH) 100 unit/mL (3 mL) injection pen Inject sixty Units under the skin every morning.    lancets MISC Use one each as directed three times daily before meals. ICD-10: Type 2 diabetes with hyperglycemia, insulin use E11.65, Z79.4    magnesium oxide (MAGOX) 400 mg (241.3 mg magnesium) tablet Take one tablet by mouth twice daily.    metroNIDAZOLE (FLAGYL) 500 mg tablet Take one tablet by mouth twice daily for 35 days. Take with food. Do not drink alcohol while on metronidazole.  Indications: bone/joint infection    oxyCODONE (ROXICODONE) 5 mg tablet Take one tablet by mouth every 8 hours as needed.    pantoprazole DR (PROTONIX) 40 mg tablet Take one tablet by mouth daily.    pen needle, diabetic (TECHLITE PEN NEEDLE) 32 gauge x 5/32 pen needle Use one each as directed as Needed.    polyethylene glycol 3350 (MIRALAX) 17 g packet Take one packet by mouth daily as needed.    potassium chloride SR (KLOR-CON M20) 20 mEq tablet Take three tablets by mouth twice daily.    semaglutide (OZEMPIC) 0.25 mg or 0.5 mg(2 mg/1.5 mL) injection PEN Inject one-quarter mg under the skin every 7 days.    sennosides-docusate sodium (SENOKOT-S) 8.6/50 mg tablet Take two tablets by mouth daily as needed.    torsemide (DEMADEX) 20 mg tablet Take two tablets by mouth twice daily.    vancomycin (VANCOCIN) 1,750 mg in sodium chloride 0.9% (NS) 285 mL IVPB Administer one thousand seven hundred fifty mg through vein every 12 hours. Indications: bone/joint infection         Vitals:    08/11/22 1300   BP: 127/80   BP Source: Arm, Left Upper   Pulse: 72   Temp: 36.6 ?C (97.9 ?F)   TempSrc: Oral   PainSc: Three   Weight: 118.8 kg (262 lb)   Height: 172.7 cm (5' 8)     Physical Exam:  Gen:  Alert, well appearing, no acute distress  HEENT:  Wound vac over R side of clavicle, no surrounding erythema  Lungs:  Clear bilaterally  Heart:  RRR  Skin:  No rash      Lab/Radiology/Other Diagnostic Tests:  CBC w/Diff    Lab Results   Component Value Date/Time    WBC 5.3 08/10/2022 12:00 AM    RBC 4.99 08/10/2022 12:00 AM    HGB 14.1 08/10/2022 12:00 AM    HCT 42.6 08/10/2022 12:00 AM    MCV 85 08/10/2022 12:00 AM    MCH 28.3 08/10/2022 12:00 AM    MCHC 33.1 08/10/2022 12:00 AM    RDW 14.3 08/10/2022 12:00 AM    PLTCT 188 08/10/2022 12:00 AM    MPV 9.9 08/06/2022 12:00 AM    Lab Results   Component Value Date/Time    NEUT 66 08/10/2022 12:00 AM    ANC 3.6 08/10/2022 12:00 AM    LYMA 18 08/10/2022 12:00 AM    ALC 1.0 08/10/2022 12:00 AM    MONA 11 08/10/2022 12:00 AM    AMC 0.6 08/10/2022 12:00 AM    EOSA 2 03/24/2022 02:49 AM    AEC 0.2 08/10/2022 12:00 AM    BASA 1 08/10/2022 12:00 AM    ABC 0.0 08/10/2022 12:00 AM         Comprehensive Metabolic Profile    Lab Results   Component Value Date/Time    NA 141 08/10/2022 12:00  AM    K 3.7 08/06/2022 08:11 AM    CL 100 08/10/2022 12:00 AM    CO2 23 08/10/2022 12:00 AM    GAP 1.4 08/10/2022 12:00 AM    BUN 19 08/10/2022 12:00 AM    CR 1.02 08/10/2022 12:00 AM    GLU 117 (H) 07/28/2022 04:55 AM    Lab Results   Component Value Date/Time    CA 8.9 08/10/2022 12:00 AM    PO4 4.4 07/28/2022 04:55 AM    ALBUMIN 3.7 08/10/2022 12:00 AM    TOTPROT 6.4 08/10/2022 12:00 AM    ALKPHOS 95 08/10/2022 12:00 AM    AST 18 08/10/2022 12:00 AM    ALT 14 08/10/2022 12:00 AM    TOTBILI 0.3 08/10/2022 12:00 AM    GFR 84 08/10/2022 12:00 AM                    Assessment / Plan :  R sided clavicular osteomyelitis  Hx of streptococcal bacteremia 2/2 necrotizing right neck cellulitis complicated by sternoclavicular abscess  -03/08/22: Admission to [facility] ([place], [place]) with neck pain  -03/08/22 blood cx ([facility]): Group C Streptococcus (S to penicillin, levofloxacin, CTX)   -03/09/22 CT neck: mild skin thickening with inflammatory stranding and muscle edema of R side of neck  -03/09/22 TTE: technically difficult study, no clinically significant valvular disease  -03/10/22 blood cx ([facility]): Group C Streptococcus   -03/13/22 CT chest w/o: phlegmon involving R side of the neck encasing manubrium and extending into anterior mediastinum and R lung apex  -03/13/22 MRI C spine: infection w/i the R carotid space and R side of the neck with extension into preverterbal soft tissuee and mediastinum  -03/14/22 blood cx x2: NGTD   -03/16/22 CT chest: Large chest wall gas and fluid containing collection with adjacent fat stranding surrounding the right sternoclavicular joint and portions of the right anterior first and second ribs with associated extension into the anterior right pleural space and upper mediastinum compatible with suspected sternoclavicular joint abscess  -03/16/22 CT neck w/: Right cervical extension of the right sternoclavicular joint infection with involvement of the right strap and sternocleidomastoid muscles by the complex gas and fluid collection consistent with infectious myositis, RIJ remains grossly patent  -03/16/22 TEE w/o valvular vegetations  -03/17/22 OR for debridement with ENT and CTS, with purulence extended about 7cm into prepleural space, wound vac placed  -03/17/22 R chest wall swab - G/S few PMNs, rare GPC, Cx: Streptococcus dysgalactiae  -03/17/22 R chest wall swab - G/s few PMNs, rare GPC; Cx: Streptococcus dysgalactiae  -03/17/22 tissue R chest wall - G/S many PMNs, NOS; Cx: Streptococcus dysgalactiae  -03/19/22 back to OR with no additional purulence or necrotic tissue noted  -03/19/22 tissue Rt neck - G/S rare PMNs, GPC; Cx NGTD  -03/20/22 extraction of multiple teeth  -Was on IV abx and switched to Augmentin upon discharge. Completed mid 03/2022  -05/2022 CT neck (OSH): howed erosive changes around clavicle with small cutaneous fistula tract  -Last seen by CTS in clinic on 07/17/22, with plans to go to OR on 07/21/22 for wound debridement and wound vac placement  - 07/21/22: OR for right SC joint debridement, resection of right medial clavicle, and wound vac placement  - 07/21/22 Right SC joint tissue culture with Corynebacterium striatum, Staphylococcus epidermidis, Cutibacterium  - 07/21/22 Right clavicle culture with Corynbacterium striatum, Cutibacterium  - 07/21/22 R medial clavicular head pathology:  fibrosis and reactive changes  - 07/24/22 s/p wound  vac change, per operative report healthy granulation tissue, no need for additional debridement        IDDM- per pt, last A1c ~8  HLD    Recommendations:  -Continue Vancomycin  and metronidazole 500 mg PO BID  -Plan for minimum of 6 weeks, target end date 09/01/22  -If wound continues to heal anticipate stopping at this time as Pt has had adequate source control in the form of resection  -Continue weekly labs with CBC w/ diff, CMP, CRP, Vanc trough weekly, faxed to [phone]  -ID follow up to be guided by clinical course, if any concerns at CTS follow up please contact me    Mamie Nick, MD  Division of Infectious Diseases  Pager [phone]  Please contact via Voalte                             Total Time Today was 30 minutes in the following activities: Preparing to see the patient, Obtaining and/or reviewing separately obtained history, Performing a medically appropriate examination and/or evaluation, Counseling and educating the patient/family/caregiver, and Documenting clinical information in the electronic or other health record

## 2022-08-12 ENCOUNTER — Encounter: Admit: 2022-08-12 | Discharge: 2022-08-12 | Payer: BC Managed Care – HMO

## 2022-08-12 DIAGNOSIS — M86119 Other acute osteomyelitis, unspecified shoulder: Secondary | ICD-10-CM

## 2022-08-12 DIAGNOSIS — Z792 Long term (current) use of antibiotics: Secondary | ICD-10-CM

## 2022-08-12 LAB — COMPREHENSIVE METABOLIC PANEL
ALK PHOSPHATASE: 95
ALT: 14
AST: 18
BLD UREA NITROGEN: 19
CREATININE: 1

## 2022-08-12 LAB — CBC AND DIFF: WBC COUNT: 5.3

## 2022-08-12 LAB — C REACTIVE PROTEIN (CRP): C-REACTIVE PROTEIN: 3

## 2022-08-13 ENCOUNTER — Encounter: Admit: 2022-08-13 | Discharge: 2022-08-13 | Payer: BC Managed Care – HMO

## 2022-08-13 DIAGNOSIS — M86119 Other acute osteomyelitis, unspecified shoulder: Secondary | ICD-10-CM

## 2022-08-13 DIAGNOSIS — Z792 Long term (current) use of antibiotics: Secondary | ICD-10-CM

## 2022-08-13 LAB — VANCOMYCIN TROUGH: VANCOMYCIN TROUGH: 13

## 2022-08-17 ENCOUNTER — Encounter: Admit: 2022-08-17 | Discharge: 2022-08-17 | Payer: BC Managed Care – HMO

## 2022-08-18 ENCOUNTER — Encounter: Admit: 2022-08-18 | Discharge: 2022-08-18 | Payer: BC Managed Care – HMO

## 2022-08-18 DIAGNOSIS — M86119 Other acute osteomyelitis, unspecified shoulder: Secondary | ICD-10-CM

## 2022-08-18 DIAGNOSIS — Z792 Long term (current) use of antibiotics: Secondary | ICD-10-CM

## 2022-08-18 LAB — CBC AND DIFF
PLATELET COUNT: 198
WBC COUNT: 5.9

## 2022-08-18 LAB — COMPREHENSIVE METABOLIC PANEL
AST: 12
CREATININE: 0.9

## 2022-08-18 LAB — C REACTIVE PROTEIN (CRP): C-REACTIVE PROTEIN: 3

## 2022-08-18 NOTE — Telephone Encounter
Patient's labs received and reviewed, VT 17.1 and Cr 0.92. Per Dr. Fredna Dow, patient is to hold tonight's dose of vancomycin and then restart tomorrow with decreased dose at 1250mg  IV q12h. Chris Rph at [facility] given updated orders.     Attempted to call patient's [place] but no one answered. Will fax over orders for dose change and VT re draw on Friday morning.     Infectious Disease Outpatient Orders:    Today's Date: 08/18/22    Per Dr. Fredna Dow    DX: clavicular osteomyelitis    1.Patient is to hold evening dose of Vancomycin on 08/18/22.     2. BEGIN Vancomycin 1250mg  IV q12hrs on 08/19/22.     3. DRAW Vancomycin trough on 08/21/22 prior to morning dose. CONTINUE TO draw weekly CBC w/Diff, CMP, and Vancomycin trough every Monday.    4. Please draw labs from picc/central line. If unable to draw back blood from line then do a peripheral stick to obtain labs. Also call ID RN to report picc/central line is not drawing back blood. It labs are not drawn as scheduled for whatever reason, please also call ID RN at [phone].      5. Please Fax All Lab Results to Dr. Fredna Dow at [phone].    6. Please monitor and maintain central line per agency's protocol. Please change dressing in sterile fashion completely including Biopatch, extension tubing and adapters Every 7 days and as needed. ([facility] to provide Biopatch and picc dressing supplies.)         Thank you,         Please Address Questions, Abnormal or Critical Lab Results:    Call Joni Reining RN at [phone]. Monday thru Friday 8am - 4pm      After 4pm, On the Weekends, or on a Holiday: Page the ID Fellow On Call at [phone]

## 2022-08-19 ENCOUNTER — Ambulatory Visit: Admit: 2022-08-19 | Discharge: 2022-08-19 | Payer: BC Managed Care – HMO

## 2022-08-19 ENCOUNTER — Encounter: Admit: 2022-08-19 | Discharge: 2022-08-19 | Payer: BC Managed Care – HMO

## 2022-08-25 ENCOUNTER — Encounter: Admit: 2022-08-25 | Discharge: 2022-08-25 | Payer: BC Managed Care – HMO

## 2022-08-25 DIAGNOSIS — M86119 Other acute osteomyelitis, unspecified shoulder: Secondary | ICD-10-CM

## 2022-08-25 DIAGNOSIS — Z792 Long term (current) use of antibiotics: Secondary | ICD-10-CM

## 2022-08-25 LAB — CBC AND DIFF
HEMOGLOBIN: 13
PLATELET COUNT: 164
WBC COUNT: 4.7

## 2022-08-25 LAB — COMPREHENSIVE METABOLIC PANEL

## 2022-08-25 LAB — VANCOMYCIN TROUGH: VANCOMYCIN TROUGH: 13

## 2022-08-25 LAB — C REACTIVE PROTEIN (CRP): C-REACTIVE PROTEIN: 2.3

## 2022-08-26 ENCOUNTER — Encounter: Admit: 2022-08-26 | Discharge: 2022-08-26 | Payer: BC Managed Care – HMO

## 2022-08-26 ENCOUNTER — Ambulatory Visit: Admit: 2022-08-26 | Discharge: 2022-08-26 | Payer: BC Managed Care – HMO

## 2022-08-26 NOTE — Progress Notes
Patient presents for wound evaluation and wound vac change. Dressing removed, wound evaluated by S. Oxandale, APRN. Measurements 3.0cm width x 1.25cm height x 2.0cm deep. No changes to current orders. Wound vac dressing changed. Patient will return to clinic for wound eval on 2/7. Earney Mallet, RN

## 2022-09-01 ENCOUNTER — Encounter: Admit: 2022-09-01 | Discharge: 2022-09-01 | Payer: BC Managed Care – HMO

## 2022-09-01 DIAGNOSIS — Z792 Long term (current) use of antibiotics: Secondary | ICD-10-CM

## 2022-09-01 DIAGNOSIS — M86119 Other acute osteomyelitis, unspecified shoulder: Secondary | ICD-10-CM

## 2022-09-01 LAB — CBC AND DIFF
HEMOGLOBIN: 14
PLATELET COUNT: 167
WBC COUNT: 5.2

## 2022-09-01 LAB — COMPREHENSIVE METABOLIC PANEL

## 2022-09-01 LAB — C REACTIVE PROTEIN (CRP): C-REACTIVE PROTEIN: 1.4

## 2022-09-01 NOTE — Telephone Encounter
Called [facility] and spoke with Kathlee Nations. Verbal order given to remove PICC line but she also requested order be faxed over.         Infectious Disease Outpatient Orders:    Today's Date: 09/01/22    Per Dr. Fredna Dow    DX: Clavicular osteomyelitis     1.Please remove PICC line after patient's last dose of IV antibiotics. Call Mickel Baas ID RN at [phone] to confirm removal.     2. DISCONTINUE weekly lab draws.           Thank you,         Please Address Questions, Abnormal or Critical Lab Results:    Call Joni Reining RN at [phone]. Monday thru Friday 8am - 4pm      After 4pm, On the Weekends, or on a Holiday: Page the ID Fellow On Call at [phone]

## 2022-09-02 ENCOUNTER — Encounter: Admit: 2022-09-02 | Discharge: 2022-09-02 | Payer: BC Managed Care – HMO

## 2022-09-02 ENCOUNTER — Ambulatory Visit: Admit: 2022-09-02 | Discharge: 2022-09-02 | Payer: BC Managed Care – HMO

## 2022-09-02 NOTE — Telephone Encounter
Patient is OPAT complete. Confirmed with [facility] RN that patient completed IV antibiotics and  PICC line removed on 09/02/22. Patient to follow up in ID clinic PRN, no labs or appointments scheduled at this time.

## 2022-09-04 ENCOUNTER — Encounter: Admit: 2022-09-04 | Discharge: 2022-09-04 | Payer: BC Managed Care – HMO

## 2022-09-04 NOTE — Progress Notes
Received call from [place], [facility] with update.  RN is measuring depth of 3.5 cm, notably at 0700 and believes she can feel bone with cotton applicator.  RN is requesting to use adaptic with next wound vac change.  Reviewed with Grant Ruts who agrees.  RN updated. Requested update on Monday with further concerns.  Pt is scheduled for evaluation on 2/14 in clinic. Fortunato Curling, RN

## 2022-09-09 ENCOUNTER — Ambulatory Visit: Admit: 2022-09-09 | Discharge: 2022-09-09 | Payer: BC Managed Care – HMO

## 2022-09-09 ENCOUNTER — Encounter: Admit: 2022-09-09 | Discharge: 2022-09-09 | Payer: BC Managed Care – HMO

## 2022-09-09 NOTE — Progress Notes
Patient presents to clinic for wound vac change. Wound measurement 3cm wide x 2cm high x 3cm deep. Wound assessed by S. Oxandale, APRN-NP, who recommends discontinuing wound vac and doing wet to dry dressing changes twice daily, supplies sent home with patient. Patient will return to clinic 2/21 for wound assessment. Leda Gauze, [facility], updated.   K. Titus Dubin, RN

## 2022-09-17 ENCOUNTER — Encounter: Admit: 2022-09-17 | Discharge: 2022-09-17 | Payer: BC Managed Care – HMO

## 2022-09-17 ENCOUNTER — Ambulatory Visit: Admit: 2022-09-17 | Discharge: 2022-09-17 | Payer: BC Managed Care – HMO

## 2022-09-17 NOTE — Progress Notes
Patient presents to clinic for wound evaluation. Dressing removed. Wound evaluated by S. Oxandale, APRN. Measurements 3.0cm deep. No changes to current orders. 1/2" packing strips secured with gauze and tape. Patient will return to clinic next week for re-evaluation. Earney Mallet, RN

## 2022-09-24 ENCOUNTER — Encounter: Admit: 2022-09-24 | Discharge: 2022-09-24 | Payer: BC Managed Care – HMO

## 2022-09-24 ENCOUNTER — Ambulatory Visit: Admit: 2022-09-24 | Discharge: 2022-09-24 | Payer: BC Managed Care – HMO

## 2022-09-24 DIAGNOSIS — M869 Osteomyelitis, unspecified: Secondary | ICD-10-CM

## 2022-09-24 LAB — GRAM STAIN

## 2022-09-24 NOTE — Progress Notes
Patient presents to clinic for wound eval/dressing change. Small amount of green drainage on dressing, patients denies any fevers, chills, or foul odor. Wound assessed by S. Oxandale, APRN-NP, who recommends Dakin's solution along with switching packing to 1/4in for wet to dry dressing. Culture taken of wound. Wound depth about 2.6 cm. Patient and family member agreeable to plan and verbalize understanding. Patient will return next week for wound eval/dressing change.   Demetrius Revel, RN

## 2022-09-28 ENCOUNTER — Encounter: Admit: 2022-09-28 | Discharge: 2022-09-28 | Payer: BC Managed Care – HMO

## 2022-09-28 NOTE — Telephone Encounter
This RN was approached by Lonn Georgia RN to request Dr. Fredna Dow follow most recent culture results and prescribe/treat. Cultures resulted and Dr. Fredna Dow recommends Levofloxacin 750 mg PO Daily x 7 days and then see if he is open to coming for an appointment on 10/06/22.     Called patient x3 today but have not been able to reach him and unable to leave a message. Will continue trying to reach patient.

## 2022-10-01 ENCOUNTER — Ambulatory Visit: Admit: 2022-10-01 | Discharge: 2022-10-01 | Payer: BC Managed Care – HMO

## 2022-10-01 ENCOUNTER — Encounter: Admit: 2022-10-01 | Discharge: 2022-10-01 | Payer: BC Managed Care – HMO

## 2022-10-01 NOTE — Progress Notes
Patient presents to clinic for wound evaluation. Dressing removed. Measuring 2.5cm deep. Creamy drainage noted, no odor. Patient denies fevers/chills. Started abx yesterday. No changes to current orders. Packing strips with Dakins secured with gauze and tape. Patient will return to clinic next week for re-evaluation. Earney Mallet, RN

## 2022-10-05 ENCOUNTER — Encounter: Admit: 2022-10-05 | Discharge: 2022-10-05 | Payer: BC Managed Care – HMO

## 2022-10-05 NOTE — Telephone Encounter
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 He is going [facility] today to see if he needs a TDAP booster. HH just wanted to give [place] an update.   Irma Newness, RN

## 2022-10-06 ENCOUNTER — Encounter: Admit: 2022-10-06 | Discharge: 2022-10-06 | Payer: BC Managed Care – HMO

## 2022-10-06 ENCOUNTER — Ambulatory Visit: Admit: 2022-10-06 | Discharge: 2022-10-06 | Payer: BC Managed Care – HMO

## 2022-10-06 DIAGNOSIS — T148XXA Other injury of unspecified body region, initial encounter: Secondary | ICD-10-CM

## 2022-10-06 DIAGNOSIS — E119 Type 2 diabetes mellitus without complications: Secondary | ICD-10-CM

## 2022-10-06 DIAGNOSIS — I10 Essential (primary) hypertension: Secondary | ICD-10-CM

## 2022-10-06 NOTE — Progress Notes
History of Present Illness:     62 y/o with a history of DM2, Streptococcal bacteremia secondary to necrotizing skin/soft tissue infection of the neck who presents for follow up sternoclavicular osteomyelitis.     Pertinent Hx:   In 02/2022 Pt was transferred to [place] from [facility] [place], [place]) for management of R sided neck infection and Group C Streptococcus bacteremia. CT neck  (03/09/22) showed mild skin thickening with inflammatory stranding and muscle edema of R side of neck. He had MRI C spine  (03/13/22) which showed infection w/i the R carotid space and R side of the neck with extension into preverterbal soft tissuee and mediastinum. CT chest (03/13/22) showed phlegmon involving R side of the neck encasing manubrium and extending into anterior mediastinum and R lung apex. He was subsequently transferred to [place] on 03/14/22. At [facility] repeat CT chest and neck (03/16/22) showed progression of infection with development of sternoclavicular abscess. He was taken to OR on 8/22 for debridement (ENT and CTS), purulence extended ~7 cm into prepleural space, wound vac was placed. He was taken back to OR on 8/24 and no additional purulence or necrotic tissue noted. S/p extraction of multiple teeth (8/25). He received IV antibiotics while hospitalized and was transitioned to Augmentin on discharge. He was continued on PO antibiotics until middle of 03/2022. At that time antibiotics were stopped as he was doing well and he had completed nearly 4 weeks of antibiotics.    His wound of his R clavicle never fully healed and he continued to have intermittent drainage. In 05/2022 he had CT at OSH facility that showed erosive changes around clavicle with small cutaneous fistula tract. He follows up with thoracic surgery and was admitted on 07/21/22 and underwent debridement with resection of R medial clavicle. Operative cultures grew Corynebacterium, Staphylococcus epidermidis and Cutibacterium. He was discharged on IV Vancomycin and metronidazole. He completed therapy on 09/01/22.    Today's visit (10/06/22)  Since the last visit he has continued to follow closely with CTS for wound clinic checks. On 09/24/22 was seen and noted to have some discolored drainage. A culture was obtained and grew 3 colonies of Pseudomonas and moderate growth of Corynebacterium species. Based on culture results was started on Levofloxacin x 7 days.     Seen to day in CTS clinic states he is doing well. Denies fevers or chills. I tolerating the antibiotics without difficulty.          Review of systems:  ROS as above      Allergies:    Allergies   Allergen Reactions    Sulfur ITCHING         acetaminophen (TYLENOL EXTRA STRENGTH) 500 mg tablet Take two tablets by mouth every 6 hours as needed. Max of 4,000 mg of acetaminophen in 24 hours.    allopurinoL (ZYLOPRIM) 300 mg tablet Take one tablet by mouth daily. Take with food.    amLODIPine (NORVASC) 5 mg tablet Take one tablet by mouth daily.    blood sugar diagnostic (GLUCOCARD EXPRESSION) test strip Use one strip as directed three times daily before meals. ICD-10: Type 2 diabetes with hyperglycemia, insulin use E11.65, Z79.4    blood-glucose meter kit ICD-10: Type 2 diabetes with hyperglycemia, insulin use E11.65, Z79.4    carvediloL (COREG) 6.25 mg tablet Take one tablet by mouth twice daily. Take with food.    glucose (DEX4 GLUCOSE) 4 gram chewable tablet Chew four tablets by mouth as Needed.    insulin aspart (U-100) (NOVOLOG  FLEXPEN U-100 INSULIN) 100 unit/mL (3 mL) PEN Inject twelve Units under the skin three times daily with meals.    insulin detemir U-100 (LEVEMIR FLEXTOUCH) 100 unit/mL (3 mL) injection pen Inject sixty Units under the skin every morning.    lancets MISC Use one each as directed three times daily before meals. ICD-10: Type 2 diabetes with hyperglycemia, insulin use E11.65, Z79.4    levoFLOXacin (LEVAQUIN) 750 mg tablet Take one tablet by mouth daily for 7 days.    magnesium oxide (MAGOX) 400 mg (241.3 mg magnesium) tablet Take one tablet by mouth twice daily.    oxyCODONE (ROXICODONE) 5 mg tablet Take one tablet by mouth every 8 hours as needed.    pantoprazole DR (PROTONIX) 40 mg tablet Take one tablet by mouth daily.    pen needle, diabetic (TECHLITE PEN NEEDLE) 32 gauge x 5/32 pen needle Use one each as directed as Needed.    polyethylene glycol 3350 (MIRALAX) 17 g packet Take one packet by mouth daily as needed.    potassium chloride SR (KLOR-CON M20) 20 mEq tablet Take three tablets by mouth twice daily.    semaglutide (OZEMPIC) 0.25 mg or 0.5 mg(2 mg/1.5 mL) injection PEN Inject one-quarter mg under the skin every 7 days.    sennosides-docusate sodium (SENOKOT-S) 8.6/50 mg tablet Take two tablets by mouth daily as needed.    torsemide (DEMADEX) 20 mg tablet Take two tablets by mouth twice daily.         There were no vitals filed for this visit.    Physical Exam:  Gen:  Alert, well appearing, no acute distress  HEENT:  small wound over R clavicle with no expressible drainage, no surrounding erythema, wound does not track deep.   Lungs:  Clear bilaterally  Heart:  RRR  Skin:  No rash      Lab/Radiology/Other Diagnostic Tests:  CBC w/Diff    Lab Results   Component Value Date/Time    WBC 5.2 08/31/2022 08:07 AM    RBC 5.05 08/31/2022 12:00 AM    HGB 14.3 08/31/2022 08:07 AM    HCT 43.3 08/31/2022 12:00 AM    MCV 86 08/31/2022 12:00 AM    MCH 28.3 08/31/2022 12:00 AM    MCHC 33.0 08/31/2022 12:00 AM    RDW 14.9 08/31/2022 12:00 AM    PLTCT 167 08/31/2022 08:07 AM    MPV 11.0 08/17/2022 12:00 AM    Lab Results   Component Value Date/Time    NEUT 65 08/31/2022 12:00 AM    ANC 3.4 08/31/2022 12:00 AM    LYMA 19 08/31/2022 12:00 AM    ALC 1.0 08/31/2022 12:00 AM    MONA 11 08/31/2022 12:00 AM    AMC 0.6 08/31/2022 12:00 AM    EOSA 2 03/24/2022 02:49 AM    AEC 0.2 08/31/2022 12:00 AM    BASA 1 08/31/2022 12:00 AM    ABC 0.0 08/31/2022 12:00 AM         Comprehensive Metabolic Profile    Lab Results Component Value Date/Time    NA 140 08/31/2022 12:00 AM    K 3.6 08/31/2022 08:07 AM    CL 100 08/31/2022 12:00 AM    CO2 27 08/31/2022 12:00 AM    GAP 1.7 08/31/2022 12:00 AM    BUN 15 08/31/2022 08:07 AM    CR 0.85 08/31/2022 08:07 AM    GLU 149 08/31/2022 12:00 AM    Lab Results   Component Value Date/Time  CA 9.1 08/31/2022 12:00 AM    PO4 4.4 07/28/2022 04:55 AM    ALBUMIN 4.0 08/31/2022 12:00 AM    TOTPROT 6.3 08/31/2022 12:00 AM    ALKPHOS 82 08/31/2022 08:07 AM    AST 12 08/31/2022 08:07 AM    ALT 13 08/31/2022 08:07 AM    TOTBILI 0.3 08/31/2022 12:00 AM    GFR 99 08/31/2022 12:00 AM                    Assessment / Plan :    Chronic R upper chest wound  -Since surgery below has had wound on R upper chest  -09/24/22 seen by CTS nursing for dressing change and noted to have small amount of drainage, wound cx was taken and grew 3 colonies of Pseudomonas (S to Levofloxacin) and Moderate growth of Corynebacterium species  -Based on cx results was prescribed 7 days of levofloxacin  -10/06/22 no evidence of wound infection based on exam       R sided clavicular osteomyelitis  Hx of streptococcal bacteremia 2/2 necrotizing right neck cellulitis complicated by sternoclavicular abscess  -03/08/22: Admission to [facility] ([place], [place]) with neck pain  -03/08/22 blood cx ([facility]): Group C Streptococcus (S to penicillin, levofloxacin, CTX)   -03/09/22 CT neck: mild skin thickening with inflammatory stranding and muscle edema of R side of neck  -03/09/22 TTE: technically difficult study, no clinically significant valvular disease  -03/10/22 blood cx ([facility]): Group C Streptococcus   -03/13/22 CT chest w/o: phlegmon involving R side of the neck encasing manubrium and extending into anterior mediastinum and R lung apex  -03/13/22 MRI C spine: infection w/i the R carotid space and R side of the neck with extension into preverterbal soft tissuee and mediastinum  -03/14/22 blood cx x2: NGTD   -03/16/22 CT chest: Large chest wall gas and fluid containing collection with adjacent fat stranding surrounding the right sternoclavicular joint and portions of the right anterior first and second ribs with associated extension into the anterior right pleural space and upper mediastinum compatible with suspected sternoclavicular joint abscess  -03/16/22 CT neck w/: Right cervical extension of the right sternoclavicular joint infection with involvement of the right strap and sternocleidomastoid muscles by the complex gas and fluid collection consistent with infectious myositis, RIJ remains grossly patent  -03/16/22 TEE w/o valvular vegetations  -03/17/22 OR for debridement with ENT and CTS, with purulence extended about 7cm into prepleural space, wound vac placed  -03/17/22 R chest wall swab - G/S few PMNs, rare GPC, Cx: Streptococcus dysgalactiae  -03/17/22 R chest wall swab - G/s few PMNs, rare GPC; Cx: Streptococcus dysgalactiae  -03/17/22 tissue R chest wall - G/S many PMNs, NOS; Cx: Streptococcus dysgalactiae  -03/19/22 back to OR with no additional purulence or necrotic tissue noted  -03/19/22 tissue Rt neck - G/S rare PMNs, GPC; Cx NGTD  -03/20/22 extraction of multiple teeth  -Was on IV abx and switched to Augmentin upon discharge. Completed mid 03/2022  -05/2022 CT neck (OSH): howed erosive changes around clavicle with small cutaneous fistula tract  -Last seen by CTS in clinic on 07/17/22, with plans to go to OR on 07/21/22 for wound debridement and wound vac placement  - 07/21/22: OR for right SC joint debridement, resection of right medial clavicle, and wound vac placement  - 07/21/22 Right SC joint tissue culture with Corynebacterium striatum, Staphylococcus epidermidis, Cutibacterium  - 07/21/22 Right clavicle culture with Corynbacterium striatum, Cutibacterium  - 07/21/22 R medial clavicular head pathology:  fibrosis and reactive changes  - 07/24/22 s/p wound vac change, per operative report healthy granulation tissue, no need for additional debridement  -09/01/22 completed 6 weeks of antibiotics         IDDM  HLD    Recommendations:  -Pt is doing well clinically and finished his last dose of antibiotics this AM  -Given exam is not concerning recommending monitoring off antibiotics  -Follow up with CTS in 1 week for wound check. Please contact ID if any concerning findings at that visit   -Follow up with ID prn         Mamie Nick, MD  Division of Infectious Diseases  Pager [phone]  Please contact via Voalte                               Total Time Today was 20 minutes in the following activities: Preparing to see the patient, Obtaining and/or reviewing separately obtained history, Counseling and educating the patient/family/caregiver, and Documenting clinical information in the electronic or other health record

## 2022-10-06 NOTE — Progress Notes
Patient presents to clinic for wound evaluation. Dressing removed with small amount of creamy drainage noted, no odor detected.  Patient denies fevers/chills. Dr. Fredna Dow to bedside for ID follow up.  Patient took last dose of abx this morning.  Granulation tissue noted with small opening at 10-11 o'clock tunneling approximately 3 cm towards 7-9 o'clock. Packing strips with Dakins packed into opening and secured with gauze and tape. Patient will return to clinic next week for re-evaluation. Patient reports increase in blood sugar over the last few days.  Advised patient to contact Dr. Isidore Moos (PCP) for adjustment to diabetic regimen. Call placed to Dr. Golden Hurter nurse, Jacqlyn Larsen, who will review with Dr. Isidore Moos and reach out to patient. Fortunato Curling, RN

## 2022-10-12 ENCOUNTER — Encounter: Admit: 2022-10-12 | Discharge: 2022-10-12 | Payer: BC Managed Care – HMO

## 2022-10-12 NOTE — Telephone Encounter
Received call from [place], [facility] RN, stating she measured patients wound and got 2.8 cm in depth. Patient has would check appointment 3/21 with [place] and we will update Leda Gauze with any changes.  Demetrius Revel, RN

## 2022-10-15 ENCOUNTER — Encounter: Admit: 2022-10-15 | Discharge: 2022-10-15 | Payer: BC Managed Care – HMO

## 2022-10-15 ENCOUNTER — Ambulatory Visit: Admit: 2022-10-15 | Discharge: 2022-10-15 | Payer: BC Managed Care – HMO

## 2022-10-15 DIAGNOSIS — M869 Osteomyelitis, unspecified: Secondary | ICD-10-CM

## 2022-10-15 DIAGNOSIS — T148XXD Other injury of unspecified body region, subsequent encounter: Secondary | ICD-10-CM

## 2022-10-15 LAB — GRAM STAIN

## 2022-10-15 NOTE — Progress Notes
Patient presents to clinic for wound evaluation. Current dressing with visible amount of creamy yellow drainage, no foul odor. Patient denies fevers/chills. Patient/brothe report drainage has increased since completion of abx last week. Drainage has changed from clear to yellowish in the last couple of days. Elder Love, APRN present for wound assessment. Wound measuring approx 1 cm x 1cm with small opening. Tunneling approximately 2.75 cm when patient is looking straight, 3.75 cm when patient turns head and looks toward his left. Cultures obtained, silver nitrate applied by Grant Ruts, APRN. Packing strips with Dakins packed into opening and secured with gauze and tape. Patient will return to clinic next week for re-evaluation. Patient reports improvement in blood sugars after Endocrine team adjusted medications. Will update Dr. Fredna Dow on today's visit and follow up with patient when cultures are final. Earney Mallet, RN

## 2022-10-19 ENCOUNTER — Encounter: Admit: 2022-10-19 | Discharge: 2022-10-19 | Payer: BC Managed Care – HMO

## 2022-10-22 ENCOUNTER — Ambulatory Visit: Admit: 2022-10-22 | Discharge: 2022-10-22 | Payer: BC Managed Care – HMO

## 2022-10-22 ENCOUNTER — Encounter: Admit: 2022-10-22 | Discharge: 2022-10-22 | Payer: BC Managed Care – HMO

## 2022-10-22 NOTE — Progress Notes
Patient presents to clinic for wound evaluation. Dressing with visible bloody and green drainage, no odor present. Patient denies any fevers or chills. Wound measuring around 1cm x 1cm with small opening. Depth measuring around 2.75cm. S. [place]and[place], APRN-NP, at bedside to eval wound. No changes to current regimen. Wound packed with packing strips with Dakins, secured with gauze and tape. Patient to return to clinic next week for re-evaluation. Educated patient and brother to call if there are any concerns or if patient begins to develop any signs of infection.   Demetrius Revel, RN

## 2022-10-29 ENCOUNTER — Ambulatory Visit: Admit: 2022-10-29 | Discharge: 2022-10-29 | Payer: BC Managed Care – HMO

## 2022-10-29 ENCOUNTER — Encounter: Admit: 2022-10-29 | Discharge: 2022-10-29 | Payer: BC Managed Care – HMO

## 2022-10-29 DIAGNOSIS — T148XXA Other injury of unspecified body region, initial encounter: Secondary | ICD-10-CM

## 2022-10-29 NOTE — Progress Notes
Patient presents to clinic for wound evaluation. Dressing with visible creamydrainage, no odor present. Per patient/brother wound had moderate amount of bloody drainage on Monday that saturated dressing. No bloody drainage today. Patient denies any fevers or chills. Wound measuring around 1cm x 1cm with small opening. Depth measuring 2.75cm. S. [place]and[place], APRN-NP, at bedside to eval wound. Wound packed with packing strips with Dakins, secured with gauze and tape.Discussed with [name] who recommends continuing with current regimen. Wound clinic referral placed, will follow for patient scheduling with their tea and coordinate our appts with them. No changes to current regimen. Educated patient and brother to call if there are any concerns or if patient begins to develop any signs of infection.   Lamount Cranker, RN

## 2022-11-12 ENCOUNTER — Encounter: Admit: 2022-11-12 | Discharge: 2022-11-12 | Payer: BC Managed Care – HMO

## 2022-11-12 ENCOUNTER — Ambulatory Visit: Admit: 2022-11-12 | Discharge: 2022-11-13 | Payer: BC Managed Care – HMO

## 2022-11-12 ENCOUNTER — Ambulatory Visit: Admit: 2022-11-12 | Discharge: 2022-11-12 | Payer: BC Managed Care – HMO

## 2022-11-12 DIAGNOSIS — M869 Osteomyelitis, unspecified: Secondary | ICD-10-CM

## 2022-11-12 DIAGNOSIS — E11622 Type 2 diabetes mellitus with other skin ulcer: Secondary | ICD-10-CM

## 2022-11-12 DIAGNOSIS — T300 Burn of unspecified body region, unspecified degree: Secondary | ICD-10-CM

## 2022-11-12 DIAGNOSIS — T8189XA Other complications of procedures, not elsewhere classified, initial encounter: Secondary | ICD-10-CM

## 2022-11-12 DIAGNOSIS — E119 Type 2 diabetes mellitus without complications: Secondary | ICD-10-CM

## 2022-11-12 DIAGNOSIS — I10 Essential (primary) hypertension: Secondary | ICD-10-CM

## 2022-11-12 LAB — SED RATE: ESR: 7 mm/h (ref 0–20)

## 2022-11-12 LAB — C REACTIVE PROTEIN (CRP): C-REACTIVE PROTEIN: 0.1 mg/dL (ref <1.0)

## 2022-11-12 MED ORDER — GENTAMICIN 0.1 % TP CREA
Freq: Once | TOPICAL | 0 refills | Status: CP
Start: 2022-11-12 — End: ?

## 2022-11-12 MED ORDER — COLLAGENASE CLOSTRIDIUM HISTO. 250 UNIT/GRAM TP OINT
Freq: Once | TOPICAL | 0 refills | Status: CP
Start: 2022-11-12 — End: ?

## 2022-11-12 NOTE — Patient Instructions
Irrigate wound with 1/4 str dakin's  Then lightly pack with Santyl, gentamicin and drawtex, cover with ABD and change daily (or twice a day if necessary)  Apply zinc paste to periwound  Await wound culture  Discussed HBOT, schedule HBOT with Dr. [name]  Email [name] regarding flap closure with Plastic Surgery  Optimize nutrition and glycemic control  RTC next week

## 2022-11-13 DIAGNOSIS — T148XXA Other injury of unspecified body region, initial encounter: Secondary | ICD-10-CM

## 2022-11-19 ENCOUNTER — Encounter: Admit: 2022-11-19 | Discharge: 2022-11-19 | Payer: BC Managed Care – HMO

## 2022-11-19 DIAGNOSIS — E119 Type 2 diabetes mellitus without complications: Secondary | ICD-10-CM

## 2022-11-19 DIAGNOSIS — I10 Essential (primary) hypertension: Secondary | ICD-10-CM

## 2022-11-19 DIAGNOSIS — E11622 Type 2 diabetes mellitus with other skin ulcer: Secondary | ICD-10-CM

## 2022-11-19 DIAGNOSIS — T300 Burn of unspecified body region, unspecified degree: Secondary | ICD-10-CM

## 2022-11-19 MED ORDER — GENTAMICIN 0.1 % TP CREA
Freq: Once | TOPICAL | 0 refills | Status: CP
Start: 2022-11-19 — End: ?
  Administered 2022-11-19: 15:00:00 via TOPICAL
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
## 2022-11-20 ENCOUNTER — Ambulatory Visit: Admit: 2022-11-19 | Discharge: 2022-11-20 | Payer: BC Managed Care – HMO

## 2022-11-20 DIAGNOSIS — M869 Osteomyelitis, unspecified: Secondary | ICD-10-CM

## 2022-11-20 DIAGNOSIS — Z794 Long term (current) use of insulin: Secondary | ICD-10-CM

## 2022-11-20 DIAGNOSIS — T8189XA Other complications of procedures, not elsewhere classified, initial encounter: Secondary | ICD-10-CM

## 2022-12-03 ENCOUNTER — Encounter: Admit: 2022-12-03 | Discharge: 2022-12-03 | Payer: BC Managed Care – HMO

## 2022-12-03 ENCOUNTER — Ambulatory Visit: Admit: 2022-12-03 | Discharge: 2022-12-03 | Payer: BC Managed Care – HMO

## 2022-12-03 DIAGNOSIS — M869 Osteomyelitis, unspecified: Secondary | ICD-10-CM

## 2022-12-03 DIAGNOSIS — T8189XA Other complications of procedures, not elsewhere classified, initial encounter: Secondary | ICD-10-CM

## 2022-12-03 DIAGNOSIS — I10 Essential (primary) hypertension: Secondary | ICD-10-CM

## 2022-12-03 DIAGNOSIS — E11622 Type 2 diabetes mellitus with other skin ulcer: Secondary | ICD-10-CM

## 2022-12-03 DIAGNOSIS — T300 Burn of unspecified body region, unspecified degree: Secondary | ICD-10-CM

## 2022-12-03 DIAGNOSIS — E119 Type 2 diabetes mellitus without complications: Secondary | ICD-10-CM

## 2022-12-03 MED ORDER — GENTAMICIN 0.1 % TP CREA
Freq: Once | TOPICAL | 0 refills | Status: CP
Start: 2022-12-03 — End: ?
  Administered 2022-12-03: 19:00:00 via TOPICAL
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
## 2022-12-03 NOTE — Patient Instructions
Continue Local Wound Care       Irrigate wound with 1/4 str dakin's       Then lightly pack with gentamicin and nu gauze, cover with ABD and change daily (or twice a day if necessary)       Apply zinc paste to periwound  Optimize nutrition and glycemic control  RTC 12/14/22 at 1230
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
## 2022-12-14 ENCOUNTER — Ambulatory Visit: Admit: 2022-12-14 | Discharge: 2022-12-15 | Payer: BC Managed Care – HMO

## 2022-12-14 ENCOUNTER — Encounter: Admit: 2022-12-14 | Discharge: 2022-12-14 | Payer: BC Managed Care – HMO

## 2022-12-14 DIAGNOSIS — M869 Osteomyelitis, unspecified: Secondary | ICD-10-CM

## 2022-12-14 DIAGNOSIS — T300 Burn of unspecified body region, unspecified degree: Secondary | ICD-10-CM

## 2022-12-14 DIAGNOSIS — E119 Type 2 diabetes mellitus without complications: Secondary | ICD-10-CM

## 2022-12-14 DIAGNOSIS — I10 Essential (primary) hypertension: Secondary | ICD-10-CM

## 2022-12-14 DIAGNOSIS — T8189XA Other complications of procedures, not elsewhere classified, initial encounter: Secondary | ICD-10-CM

## 2022-12-14 DIAGNOSIS — E11622 Type 2 diabetes mellitus with other skin ulcer: Secondary | ICD-10-CM

## 2022-12-14 MED ORDER — GENTAMICIN 0.1 % TP CREA
Freq: Once | TOPICAL | 0 refills | Status: AC
Start: 2022-12-14 — End: ?
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
## 2022-12-14 NOTE — Patient Instructions
Continue Local Wound Care       Irrigate wound with 1/4 str dakin's       Then lightly pack with gentamicin and nu gauze, cover with ABD and change daily (or twice a day if necessary)       Apply zinc paste to periwound  Optimize nutrition and glycemic control  Email sent to [facility] to coordinate with Dr. Winfred Burn office for flap recon

## 2022-12-29 ENCOUNTER — Encounter: Admit: 2022-12-29 | Discharge: 2022-12-29 | Payer: BC Managed Care – HMO

## 2022-12-29 DIAGNOSIS — M869 Osteomyelitis, unspecified: Secondary | ICD-10-CM

## 2022-12-29 DIAGNOSIS — T148XXA Other injury of unspecified body region, initial encounter: Secondary | ICD-10-CM

## 2022-12-29 DIAGNOSIS — E1142 Type 2 diabetes mellitus with diabetic polyneuropathy: Secondary | ICD-10-CM

## 2022-12-29 DIAGNOSIS — T8189XD Other complications of procedures, not elsewhere classified, subsequent encounter: Secondary | ICD-10-CM

## 2022-12-30 ENCOUNTER — Encounter: Admit: 2022-12-30 | Discharge: 2022-12-30 | Payer: BC Managed Care – HMO

## 2022-12-30 DIAGNOSIS — T300 Burn of unspecified body region, unspecified degree: Secondary | ICD-10-CM

## 2022-12-30 DIAGNOSIS — E119 Type 2 diabetes mellitus without complications: Secondary | ICD-10-CM

## 2022-12-30 DIAGNOSIS — I10 Essential (primary) hypertension: Secondary | ICD-10-CM

## 2023-01-11 ENCOUNTER — Encounter: Admit: 2023-01-11 | Discharge: 2023-01-11 | Payer: BC Managed Care – HMO

## 2023-01-11 DIAGNOSIS — T300 Burn of unspecified body region, unspecified degree: Secondary | ICD-10-CM

## 2023-01-11 DIAGNOSIS — I10 Essential (primary) hypertension: Secondary | ICD-10-CM

## 2023-01-11 DIAGNOSIS — E119 Type 2 diabetes mellitus without complications: Secondary | ICD-10-CM

## 2023-01-11 MED ORDER — ONDANSETRON HCL (PF) 4 MG/2 ML IJ SOLN
INTRAVENOUS | 0 refills | Status: DC
Start: 2023-01-11 — End: 2023-01-11

## 2023-01-11 MED ORDER — PHENYLEPHRINE 40 MCG/ML IN NS IV DRIP (STD CONC)
INTRAVENOUS | 0 refills | Status: DC
Start: 2023-01-11 — End: 2023-01-11
  Administered 2023-01-11 (×2): .3 ug/kg/min via INTRAVENOUS

## 2023-01-11 MED ORDER — ROCURONIUM 10 MG/ML IV SOLN
INTRAVENOUS | 0 refills | Status: DC
Start: 2023-01-11 — End: 2023-01-11

## 2023-01-11 MED ORDER — PROPOFOL INJ 10 MG/ML IV VIAL
INTRAVENOUS | 0 refills | Status: DC
Start: 2023-01-11 — End: 2023-01-11

## 2023-01-11 MED ORDER — LIDOCAINE (PF) 200 MG/10 ML (2 %) IJ SYRG
INTRAVENOUS | 0 refills | Status: DC
Start: 2023-01-11 — End: 2023-01-11

## 2023-01-11 MED ORDER — ARTIFICIAL TEARS (PF) SINGLE DOSE DROPS GROUP
OPHTHALMIC | 0 refills | Status: DC
Start: 2023-01-11 — End: 2023-01-11

## 2023-01-11 MED ORDER — FENTANYL CITRATE (PF) 50 MCG/ML IJ SOLN
INTRAVENOUS | 0 refills | Status: DC
Start: 2023-01-11 — End: 2023-01-11

## 2023-01-11 MED ORDER — DEXAMETHASONE SODIUM PHOSPHATE 4 MG/ML IJ SOLN
INTRAVENOUS | 0 refills | Status: DC
Start: 2023-01-11 — End: 2023-01-11

## 2023-01-11 MED ORDER — SUGAMMADEX 100 MG/ML IV SOLN
INTRAVENOUS | 0 refills | Status: DC
Start: 2023-01-11 — End: 2023-01-11

## 2023-01-11 MED ORDER — CEFAZOLIN 1 GRAM IJ SOLR
INTRAVENOUS | 0 refills | Status: DC
Start: 2023-01-11 — End: 2023-01-11

## 2023-01-11 MED ADMIN — PANTOPRAZOLE 20 MG PO TBEC [79463]: 40 mg | ORAL | @ 20:00:00 | NDC 50268058511

## 2023-01-11 MED ADMIN — OXYCODONE 5 MG PO TAB [10814]: 5 mg | ORAL | @ 16:00:00 | Stop: 2023-01-11 | NDC 00406055223

## 2023-01-11 MED ADMIN — VANCOMYCIN 5 GRAM IV SOLR [8444]: 2500 mg | INTRAVENOUS | @ 21:00:00 | Stop: 2023-01-11 | NDC 71288002575

## 2023-01-11 MED ADMIN — MAGNESIUM OXIDE 400 MG (241.3 MG MAGNESIUM) PO TAB [10491]: 400 mg | ORAL | @ 20:00:00 | NDC 10006070028

## 2023-01-11 MED ADMIN — SODIUM CHLORIDE 0.9 % IV SOLP [27838]: 250 mL | INTRAVENOUS | @ 21:00:00 | Stop: 2023-01-11 | NDC 00338004902

## 2023-01-11 MED ADMIN — SODIUM CHLORIDE 0.9 % IV SOLP [27838]: 2500 mg | INTRAVENOUS | @ 21:00:00 | Stop: 2023-01-11 | NDC 00338004903

## 2023-01-11 MED ADMIN — INSULIN ASPART 100 UNIT/ML SC FLEXPEN [87504]: 2 [IU] | SUBCUTANEOUS | @ 21:00:00 | NDC 00169633910

## 2023-01-11 MED ADMIN — HYDROMORPHONE (PF) 2 MG/ML IJ SYRG [163476]: 0.5 mg | INTRAVENOUS | @ 17:00:00 | Stop: 2023-01-11 | NDC 00409131203

## 2023-01-11 MED ADMIN — LACTATED RINGERS IV SOLP [4318]: 1000 mL | INTRAVENOUS | @ 15:00:00 | Stop: 2023-01-11 | NDC 00338011704

## 2023-01-11 MED ADMIN — INSULIN GLARGINE 100 UNIT/ML (3 ML) SC INJ PEN [163596]: 30 [IU] | SUBCUTANEOUS | @ 22:00:00 | NDC 00088221905

## 2023-01-11 MED ADMIN — SODIUM CHLORIDE 0.9 % IV PGBK (MB+) [95161]: 2 g | INTRAVENOUS | @ 22:00:00 | NDC 00338915930

## 2023-01-11 MED ADMIN — CEFEPIME 2 GRAM IJ SOLR [78195]: 2 g | INTRAVENOUS | @ 22:00:00 | NDC 00409973501

## 2023-01-11 MED ADMIN — FENTANYL CITRATE (PF) 50 MCG/ML IJ SOLN [3037]: 25 ug | INTRAVENOUS | @ 17:00:00 | Stop: 2023-01-11 | NDC 00409909412

## 2023-01-11 MED ADMIN — ACETAMINOPHEN 500 MG PO TAB [102]: 1000 mg | ORAL | @ 18:00:00 | NDC 00904673061

## 2023-01-11 NOTE — Anesthesia Procedure Notes
Procedure: Airway Placement    AIRWAY INSERTION    Date/Time: 01/11/2023 10:18 AM    Patient location: OR  Urgency: elective  Difficult Airway: No            Airway Procedure  Indication(s) for airway management: surgery        no    Preoxygenated: yes  Patient position: sniffing  Neck stabilization: in-line stabilization    Mask difficulty assessment: 1 - vent by mask      Procedure Outcome  Final airway type: endotracheal airway  Endotracheal airway: ETT          ETT size (mm): 8.0  Technique used for successful ETT placement: direct laryngoscopy  Devices/methods used in placement: cricoid pressure  Insertion site: oral  Blade type: Macintosh   Laryngoscope/Videolaryngoscope blade size: 4  Cormack-Lehane classification: grade I - full view of glottis      Measured from: lips   Depth: 24 cm  Amount of Air in Cuff: 10 ml  Number of attempts at approach: 1  Placement verified by auscultation            Complications  Cardiovascular:   Pulmonary:   Procedure: airway not difficult  Medication:         Performed by: Thomos Lemons, MD  Authorized by: Thomos Lemons, MD

## 2023-01-11 NOTE — Anesthesia Post-Procedure Evaluation
Post-Anesthesia Evaluation    Name: Jon Pope      MRN: [id number]     DOB: [birth year]     Age: 62 y.o.     Sex: male   __________________________________________________________________________     Procedure Information       Anesthesia Start Date/Time: 01/11/23 1008    Procedures:       COVERAGE OF RIGHT CHEST AND NECK WOUND WITH PECTORALIS MAJOR MUSCLE FLAP (Right: Chest) - 2 hrs      RIGHT SternoClavicular JOINT EXPLORATION (Right: Chest)    Location: MAIN OR 04 / Main OR/Periop    Surgeons: Irineo Axon, MD; Arlyce Harman, MD            Post-Anesthesia Vitals  BP: 126/78 (06/17 1200)  Temp: 36.9 ?C (98.4 ?F) (06/17 1120)  Pulse: 61 (06/17 1200)  Respirations: 17 PER MINUTE (06/17 1200)  SpO2: 96 % (06/17 1200)  O2 Device: Nasal cannula (06/17 1200)   Vitals Value Taken Time   BP 126/78 01/11/23 1200   Temp 36.9 ?C (98.4 ?F) 01/11/23 1120   Pulse 61 01/11/23 1200   Respirations 17 PER MINUTE 01/11/23 1200   SpO2 96 % 01/11/23 1200   O2 Device Nasal cannula 01/11/23 1200   ABP     ART BP           Post Anesthesia Evaluation Note    Evaluation location: Pre/Post  Patient participation: recovered; patient participated in evaluation  Level of consciousness: alert    Pain score: 4  Pain management: adequate    Hydration: normovolemia  Temperature: 36.0?C - 38.4?C  Airway patency: adequate    Perioperative Events       Post-op nausea and vomiting: no PONV    Postoperative Status  Cardiovascular status: hemodynamically stable  Respiratory status: spontaneous ventilation and supplemental oxygen        Perioperative Events  There were no known complications for this encounter.

## 2023-01-12 MED ADMIN — CEFEPIME 2 GRAM IJ SOLR [78195]: 2 g | INTRAVENOUS | @ 06:00:00 | NDC 00409973501

## 2023-01-12 MED ADMIN — VANCOMYCIN 1.5 GRAM IV SOLR [338456]: 1500 mg | INTRAVENOUS | @ 21:00:00 | NDC 00409351511

## 2023-01-12 MED ADMIN — TORSEMIDE 20 MG PO TAB [18293]: 40 mg | ORAL | @ 14:00:00 | NDC 68084053911

## 2023-01-12 MED ADMIN — CARVEDILOL 6.25 MG PO TAB [77309]: 6.25 mg | ORAL | @ 14:00:00 | NDC 00904630161

## 2023-01-12 MED ADMIN — ACETAMINOPHEN 500 MG PO TAB [102]: 1000 mg | ORAL | @ 06:00:00 | NDC 00904673061

## 2023-01-12 MED ADMIN — POTASSIUM CHLORIDE 20 MEQ PO TBTQ [35943]: 60 meq | ORAL | @ 14:00:00 | NDC 00832532510

## 2023-01-12 MED ADMIN — PANTOPRAZOLE 20 MG PO TBEC [79463]: 40 mg | ORAL | @ 14:00:00 | NDC 50268058511

## 2023-01-12 MED ADMIN — MAGNESIUM OXIDE 400 MG (241.3 MG MAGNESIUM) PO TAB [10491]: 400 mg | ORAL | @ 02:00:00 | NDC 10006070028

## 2023-01-12 MED ADMIN — SODIUM CHLORIDE 0.9 % IV SOLP [27838]: 1500 mg | INTRAVENOUS | @ 09:00:00 | NDC 00338004902

## 2023-01-12 MED ADMIN — SODIUM CHLORIDE 0.9 % IV PGBK (MB+) [95161]: 2 g | INTRAVENOUS | @ 23:00:00 | NDC 00338915930

## 2023-01-12 MED ADMIN — CEFEPIME 2 GRAM IJ SOLR [78195]: 2 g | INTRAVENOUS | @ 23:00:00 | NDC 00409973501

## 2023-01-12 MED ADMIN — SODIUM CHLORIDE 0.9 % IV PGBK (MB+) [95161]: 2 g | INTRAVENOUS | @ 06:00:00 | NDC 00338915930

## 2023-01-12 MED ADMIN — OXYCODONE 5 MG PO TAB [10814]: 5 mg | ORAL | @ 11:00:00 | NDC 42858000110

## 2023-01-12 MED ADMIN — ACETAMINOPHEN 500 MG PO TAB [102]: 1000 mg | ORAL | @ 11:00:00 | NDC 00904673061

## 2023-01-12 MED ADMIN — SODIUM CHLORIDE 0.9 % IV SOLP [27838]: 1500 mg | INTRAVENOUS | @ 21:00:00 | NDC 00338004902

## 2023-01-12 MED ADMIN — TORSEMIDE 20 MG PO TAB [18293]: 40 mg | ORAL | @ 02:00:00 | NDC 68084053911

## 2023-01-12 MED ADMIN — ACETAMINOPHEN 500 MG PO TAB [102]: 1000 mg | ORAL | @ 02:00:00 | NDC 00904673061

## 2023-01-12 MED ADMIN — ACETAMINOPHEN 500 MG PO TAB [102]: 1000 mg | ORAL | @ 18:00:00 | NDC 00904673061

## 2023-01-12 MED ADMIN — CEFEPIME 2 GRAM IJ SOLR [78195]: 2 g | INTRAVENOUS | @ 14:00:00 | NDC 00409973501

## 2023-01-12 MED ADMIN — MAGNESIUM OXIDE 400 MG (241.3 MG MAGNESIUM) PO TAB [10491]: 400 mg | ORAL | @ 14:00:00 | NDC 10006070028

## 2023-01-12 MED ADMIN — CARVEDILOL 6.25 MG PO TAB [77309]: 6.25 mg | ORAL | @ 02:00:00 | NDC 00904630161

## 2023-01-12 MED ADMIN — SODIUM CHLORIDE 0.9 % IV PGBK (MB+) [95161]: 2 g | INTRAVENOUS | @ 14:00:00 | NDC 00338915930

## 2023-01-12 MED ADMIN — POTASSIUM CHLORIDE 20 MEQ PO TBTQ [35943]: 60 meq | ORAL | @ 02:00:00 | NDC 00832532510

## 2023-01-12 MED ADMIN — SENNOSIDES-DOCUSATE SODIUM 8.6-50 MG PO TAB [40926]: 1 | ORAL | @ 02:00:00 | NDC 00536124810

## 2023-01-12 MED ADMIN — ALLOPURINOL 100 MG PO TAB [310]: 300 mg | ORAL | @ 14:00:00 | NDC 60687067711

## 2023-01-12 MED ADMIN — VANCOMYCIN 1.5 GRAM IV SOLR [338456]: 1500 mg | INTRAVENOUS | @ 09:00:00 | NDC 00409351511

## 2023-01-13 ENCOUNTER — Encounter: Admit: 2023-01-13 | Discharge: 2023-01-13 | Payer: BC Managed Care – HMO

## 2023-01-13 ENCOUNTER — Inpatient Hospital Stay: Admit: 2023-01-13 | Discharge: 2023-01-13 | Payer: BC Managed Care – HMO

## 2023-01-13 DIAGNOSIS — E119 Type 2 diabetes mellitus without complications: Secondary | ICD-10-CM

## 2023-01-13 DIAGNOSIS — I10 Essential (primary) hypertension: Secondary | ICD-10-CM

## 2023-01-13 DIAGNOSIS — T300 Burn of unspecified body region, unspecified degree: Secondary | ICD-10-CM

## 2023-01-13 MED ORDER — FENTANYL CITRATE (PF) 50 MCG/ML IJ SOLN
INTRAVENOUS | 0 refills | Status: DC
Start: 2023-01-13 — End: 2023-01-13

## 2023-01-13 MED ORDER — SUGAMMADEX 100 MG/ML IV SOLN
INTRAVENOUS | 0 refills | Status: DC
Start: 2023-01-13 — End: 2023-01-13

## 2023-01-13 MED ORDER — LIDOCAINE (PF) 200 MG/10 ML (2 %) IJ SYRG
INTRAVENOUS | 0 refills | Status: DC
Start: 2023-01-13 — End: 2023-01-13

## 2023-01-13 MED ORDER — DEXAMETHASONE SODIUM PHOSPHATE 4 MG/ML IJ SOLN
INTRAVENOUS | 0 refills | Status: DC
Start: 2023-01-13 — End: 2023-01-13

## 2023-01-13 MED ORDER — ROCURONIUM 10 MG/ML IV SOLN
INTRAVENOUS | 0 refills | Status: DC
Start: 2023-01-13 — End: 2023-01-13

## 2023-01-13 MED ORDER — LACTATED RINGERS IV SOLP
INTRAVENOUS | 0 refills | Status: DC
Start: 2023-01-13 — End: 2023-01-13

## 2023-01-13 MED ORDER — ONDANSETRON HCL (PF) 4 MG/2 ML IJ SOLN
INTRAVENOUS | 0 refills | Status: DC
Start: 2023-01-13 — End: 2023-01-13

## 2023-01-13 MED ORDER — ARTIFICIAL TEARS (PF) SINGLE DOSE DROPS GROUP
OPHTHALMIC | 0 refills | Status: DC
Start: 2023-01-13 — End: 2023-01-13

## 2023-01-13 MED ORDER — PROPOFOL INJ 10 MG/ML IV VIAL
INTRAVENOUS | 0 refills | Status: DC
Start: 2023-01-13 — End: 2023-01-13

## 2023-01-13 MED ADMIN — MAGNESIUM OXIDE 400 MG (241.3 MG MAGNESIUM) PO TAB [10491]: 400 mg | ORAL | @ 02:00:00 | NDC 10006070028

## 2023-01-13 MED ADMIN — SODIUM CHLORIDE 0.9 % IV SOLP [27838]: 1500 mg | INTRAVENOUS | @ 09:00:00 | NDC 00338004902

## 2023-01-13 MED ADMIN — CEFEPIME 2 GRAM IJ SOLR [78195]: 2 g | INTRAVENOUS | NDC 60505614700

## 2023-01-13 MED ADMIN — ALLOPURINOL 100 MG PO TAB [310]: 300 mg | ORAL | @ 14:00:00 | NDC 60687067711

## 2023-01-13 MED ADMIN — VANCOMYCIN 1.5 GRAM IV SOLR [338456]: 1500 mg | INTRAVENOUS | NDC 00409351511

## 2023-01-13 MED ADMIN — CARVEDILOL 6.25 MG PO TAB [77309]: 6.25 mg | ORAL | @ 14:00:00 | NDC 00904630161

## 2023-01-13 MED ADMIN — ACETAMINOPHEN 500 MG PO TAB [102]: 1000 mg | ORAL | @ 14:00:00 | NDC 00904673061

## 2023-01-13 MED ADMIN — SODIUM CHLORIDE 0.9 % IV SOLP [27838]: 1500 mg | INTRAVENOUS | NDC 00338004902

## 2023-01-13 MED ADMIN — POTASSIUM CHLORIDE 20 MEQ PO TBTQ [35943]: 60 meq | ORAL | @ 02:00:00 | NDC 00832532510

## 2023-01-13 MED ADMIN — CEFEPIME 2 GRAM IJ SOLR [78195]: 2 g | INTRAVENOUS | @ 07:00:00 | NDC 00409973501

## 2023-01-13 MED ADMIN — TORSEMIDE 20 MG PO TAB [18293]: 40 mg | ORAL | @ 03:00:00 | NDC 68084053911

## 2023-01-13 MED ADMIN — POTASSIUM CHLORIDE 20 MEQ PO TBTQ [35943]: 60 meq | ORAL | @ 14:00:00 | NDC 00832532511

## 2023-01-13 MED ADMIN — TORSEMIDE 20 MG PO TAB [18293]: 40 mg | ORAL | @ 14:00:00 | NDC 68084053911

## 2023-01-13 MED ADMIN — ACETAMINOPHEN 500 MG PO TAB [102]: 1000 mg | ORAL | @ 07:00:00 | NDC 00904673061

## 2023-01-13 MED ADMIN — VANCOMYCIN 1.5 GRAM IV SOLR [338456]: 1500 mg | INTRAVENOUS | @ 09:00:00 | NDC 00409351511

## 2023-01-13 MED ADMIN — ACETAMINOPHEN 500 MG PO TAB [102]: 1000 mg | ORAL | @ 02:00:00 | NDC 00904673061

## 2023-01-13 MED ADMIN — SODIUM CHLORIDE 0.9 % IV PGBK (MB+) [95161]: 2 g | INTRAVENOUS | NDC 00338915930

## 2023-01-13 MED ADMIN — SODIUM CHLORIDE 0.9 % IV SOLP [27838]: INTRAVENOUS | @ 09:00:00 | NDC 00338004902

## 2023-01-13 MED ADMIN — SODIUM CHLORIDE 0.9 % IV PGBK (MB+) [95161]: 2 g | INTRAVENOUS | @ 14:00:00 | NDC 00338915930

## 2023-01-13 MED ADMIN — MAGNESIUM OXIDE 400 MG (241.3 MG MAGNESIUM) PO TAB [10491]: 400 mg | ORAL | @ 14:00:00 | NDC 10006070028

## 2023-01-13 MED ADMIN — CARVEDILOL 6.25 MG PO TAB [77309]: 6.25 mg | ORAL | @ 02:00:00 | NDC 00904630161

## 2023-01-13 MED ADMIN — FENTANYL CITRATE (PF) 50 MCG/ML IJ SOLN [3037]: 25 ug | INTRAVENOUS | @ 22:00:00 | Stop: 2023-01-13 | NDC 00409909412

## 2023-01-13 MED ADMIN — PANTOPRAZOLE 20 MG PO TBEC [79463]: 40 mg | ORAL | @ 14:00:00 | NDC 50268058511

## 2023-01-13 MED ADMIN — LACTATED RINGERS IV SOLP [4318]: 1000 mL | INTRAVENOUS | @ 19:00:00 | Stop: 2023-01-13 | NDC 00338011704

## 2023-01-13 MED ADMIN — OXYCODONE 5 MG PO TAB [10814]: 10 mg | ORAL | @ 22:00:00 | Stop: 2023-01-13 | NDC 42858000110

## 2023-01-13 MED ADMIN — CEFEPIME 2 GRAM IJ SOLR [78195]: 2 g | INTRAVENOUS | @ 14:00:00 | NDC 60505614700

## 2023-01-13 MED ADMIN — OXYCODONE 5 MG PO TAB [10814]: 5 mg | ORAL | @ 02:00:00 | NDC 00406055223

## 2023-01-13 MED ADMIN — SODIUM CHLORIDE 0.9 % IV PGBK (MB+) [95161]: 2 g | INTRAVENOUS | @ 07:00:00 | NDC 00338915930

## 2023-01-13 NOTE — Anesthesia Post-Procedure Evaluation
Post-Anesthesia Evaluation    Name: Jon Pope      MRN: [id number]     DOB: [birth year]     Age: 62 y.o.     Sex: male   __________________________________________________________________________     Procedure Information       Anesthesia Start Date/Time: 01/13/23 1438    Procedure: Closure of upper chest/neck wound with superior thoracic artery based fasciocutaneous flap (Right: Chest) - 2 hrs    Location: MAIN OR 18 / Main OR/Periop    Surgeons: Irineo Axon, MD            Post-Anesthesia Vitals  BP: 127/79 (06/19 1730)  Pulse: 48 (06/19 1730)  Respirations: 17 PER MINUTE (06/19 1730)  SpO2: 92 % (06/19 1730)  O2 Device: Nasal cannula (06/19 1730)   Vitals Value Taken Time   BP 127/79 01/13/23 1730   Temp 36.1 ?C (97 ?F) 01/13/23 1616   Pulse 48 01/13/23 1730   Respirations 17 PER MINUTE 01/13/23 1730   SpO2 92 % 01/13/23 1730   O2 Device Nasal cannula 01/13/23 1730   ABP     ART BP           Post Anesthesia Evaluation Note    Evaluation location: Pre/Post  Patient participation: recovered; patient participated in evaluation  Level of consciousness: alert    Pain score: 0  Pain management: adequate    Hydration: normovolemia  Temperature: 36.0?C - 38.4?C  Airway patency: adequate    Perioperative Events       Post-op nausea and vomiting: no PONV    Postoperative Status  Cardiovascular status: hemodynamically stable  Respiratory status: spontaneous ventilation  Follow-up needed: none        Perioperative Events  There were no known complications for this encounter.

## 2023-01-13 NOTE — Anesthesia Procedure Notes
Procedure: Airway Placement    AIRWAY INSERTION    Date/Time: 01/13/2023 2:53 PM    Patient location: OR  Urgency: elective  Difficult Airway: No            Airway Procedure  Indication(s) for airway management: surgery        no    Preoxygenated: yes  Patient position: sniffing  Neck stabilization: no in-line stabilization    Mask difficulty assessment: 1 - vent by mask      Procedure Outcome  Final airway type: endotracheal airway  Endotracheal airway: ETT          ETT size (mm): 7.5  Technique used for successful ETT placement: direct laryngoscopy  Devices/methods used in placement: intubating stylet  Insertion site: oral  Blade type: Macintosh   Laryngoscope/Videolaryngoscope blade size: 4  Cormack-Lehane classification: grade IIa - partial view of glottis      Measured from: teeth   Depth: 238 cm    Number of attempts at approach: 1  Placement verified by auscultation            Complications  Cardiovascular:   Pulmonary:   Procedure: airway not difficult  Medication:         Performed by: Brett Fairy, CRNA  Authorized by: Dale Durham, DO

## 2023-01-13 NOTE — Anesthesia Pre-Procedure Evaluation
Anesthesia Pre-Procedure Evaluation    Name: Jon Pope      MRN: [id number]     DOB: 30-Apr-1961     Age: 62 y.o.     Sex: male   _________________________________________________________________________     Procedure Info:   Procedure Information       Date/Time: 01/13/23 1515    Procedure: TISSUE TRANSFER MUSCLE/ MYOCUTANEOUS/ FASCIOCUTANOUS FLAP - TRUNK (Right) - 2 hrs    Location: MAIN OR 18 / Main OR/Periop    Surgeons: Irineo Axon, MD            Physical Assessment  Vital Signs (last filed in past 24 hours):  BP: 111/74 (06/19 0810)  Temp: 36.7 ?C (98.1 ?F) (06/19 3710)  Pulse: 50 (06/19 0810)  Respirations: 18 PER MINUTE (06/19 0810)  SpO2: 98 % (06/19 0810)  O2 Device: None (Room air) (06/19 0810)      Patient History   Allergies   Allergen Reactions    Sulfur ITCHING        Current Medications    Medication Directions   acetaminophen (TYLENOL EXTRA STRENGTH) 500 mg tablet Take two tablets by mouth every 6 hours as needed. Max of 4,000 mg of acetaminophen in 24 hours.   allopurinoL (ZYLOPRIM) 300 mg tablet Take one tablet by mouth daily. Take with food.   amLODIPine (NORVASC) 5 mg tablet Take one tablet by mouth daily.   blood sugar diagnostic (GLUCOCARD EXPRESSION) test strip Use one strip as directed three times daily before meals. ICD-10: Type 2 diabetes with hyperglycemia, insulin use E11.65, Z79.4   blood-glucose meter kit ICD-10: Type 2 diabetes with hyperglycemia, insulin use E11.65, Z79.4   carvediloL (COREG) 6.25 mg tablet Take one tablet by mouth twice daily. Take with food.   glucose (DEX4 GLUCOSE) 4 gram chewable tablet Chew four tablets by mouth as Needed.   insulin aspart (U-100) (NOVOLOG FLEXPEN U-100 INSULIN) 100 unit/mL (3 mL) PEN Inject twelve Units under the skin three times daily with meals.  Patient taking differently: Inject fifteen Units under the skin three times daily with meals.   insulin detemir U-100 (LEVEMIR FLEXTOUCH) 100 unit/mL (3 mL) injection pen Inject sixty Units under the skin every morning.  Patient taking differently: Inject sixty Units under the skin twice daily. 30 Units Qam; 30 Units QHS   lancets MISC Use one each as directed three times daily before meals. ICD-10: Type 2 diabetes with hyperglycemia, insulin use E11.65, Z79.4   magnesium oxide (MAGOX) 400 mg (241.3 mg magnesium) tablet Take one tablet by mouth twice daily.   pantoprazole DR (PROTONIX) 40 mg tablet Take one tablet by mouth daily.   pen needle, diabetic (TECHLITE PEN NEEDLE) 32 gauge x 5/32 pen needle Use one each as directed as Needed.   polyethylene glycol 3350 (MIRALAX) 17 g packet Take one packet by mouth daily as needed.   potassium chloride SR (KLOR-CON M20) 20 mEq tablet Take three tablets by mouth twice daily.   semaglutide (OZEMPIC) 0.25 mg or 0.5 mg(2 mg/1.5 mL) injection PEN Inject one-half mg under the skin every 7 days.   sennosides-docusate sodium (SENOKOT-S) 8.6/50 mg tablet Take two tablets by mouth daily as needed.   torsemide (DEMADEX) 20 mg tablet Take two tablets by mouth twice daily.       Review of Systems/Medical History      Patient summary reviewed  Nursing notes reviewed  Pertinent labs reviewed    PONV Screening: Non-smoker    No family history of  anesthetic complications      Airway - negative        12/26: RSI, Mac 4, grade 1 view, 8.0 ETT      Pulmonary       Not a current smoker        No indications/hx of asthma      no COPD       No indications/hx of pneumonia        No recent URI        Obstructive Sleep Apnea          Interventions: CPAP; noncompliant      Cardiovascular       Recent diagnostic studies:          echocardiogram      Exercise tolerance: <4 METS       Beta Blocker therapy: Yes      Beta blockers within 24 hours: Yes      Hypertension    No valvular problems/murmurs          No past MI      No hx of coronary artery disease      No coronary artery bypass graft        No PTCA          PVD (Lymphedema due to venous insufficiency)      Hyperlipidemia        group G bacteremia and R neck cellulitis with concern of mediastinitis      GI/Hepatic/Renal             No GERD         No renal disease:         No nausea      No vomiting      No gastroparesis (Ozempic (last dose 12 days prior))      Neuro/Psych       No seizures      No hx neuromuscular disease      No hx TIA        No CVA      No indications/hx of sensory deficit      Musculoskeletal         No neck pain      No back pain      Chronic right shoulder pain        Endocrine/Other       Diabetes, type 2, using insulin      Most recent Hgb A1C:7.2 - 9        No hypothyroidism      No hyperthyroidism      No blood dyscrasia        Obesity: Class 3 (BMI 40-49.9)        Persistent bacteremia with CT imaging concerning for SC joint/lateral neck infection.  He underwent a Incision and debridement of the right sternoclavicular joint and wound vacuum placement with [name] on 03/17/22, ENT performed an I&D of deep neck space infection  12/26: Right sternoclavicular joint debridement and resection of right medial clavicle w/wound vac placement      Constitution - negative       Physical Exam    Airway Findings      Mallampati: III      TM distance: >3 FB      Neck ROM: full      Mouth opening: good    Dental Findings:             Cardiovascular Findings:  Rhythm: regular      Rate: normal    Pulmonary Findings:       Breath sounds clear to auscultation.    Abdominal Findings:       Obese    Neurological Findings:       Alert and oriented x 3    Constitutional findings:       No acute distress       Previous Airway Procedure Notes Displaying the 3 most recent records      Date Mask Difficulty Techninque used for succesful ETT placement Blade Type Blade Size View with successful placement Number of attempts    01/11/23 1 - vent by mask direct laryngoscopy Macintosh 4 grade I - full view of glottis 1    07/21/22 0 - not attempted direct laryngoscopy Not documented 4 grade I - full view of glottis 1            Patient Lines/Drains/Airways Status       Active Lines:       Name Placement date Placement time Site Days    Negative Pressure Wound Therapy 03/17/22 0918 03/17/22  0918  --  302    Peripheral IV 01/11/23 1952 Right Posterior Forearm 20 G 01/11/23  1952  -- 2                  Diagnostic Tests  Hematology:   Lab Results   Component Value Date    HGB 14.2 01/12/2023    HCT 41.2 01/12/2023    PLTCT 139 01/12/2023    WBC 8.1 01/12/2023    NEUT 83 01/12/2023    ANC 6.73 01/12/2023    ALC 0.87 01/12/2023    MONA 6 01/12/2023    AMC 0.47 01/12/2023    EOSA 0 01/12/2023    ABC 0.02 01/12/2023    MCV 87.2 01/12/2023    MCH 30.2 01/12/2023    MCHC 34.6 01/12/2023    MPV 9.0 01/12/2023    RDW 14.4 01/12/2023         General Chemistry:   Lab Results   Component Value Date    NA 140 01/12/2023    K 4.0 01/12/2023    CL 104 01/12/2023    CO2 25 01/12/2023    GAP 11 01/12/2023    BUN 22 01/12/2023    CR 0.98 01/12/2023    GLU 181 01/12/2023    CA 9.0 01/12/2023    ALBUMIN 3.8 01/12/2023    LACTIC 1.9 03/14/2022    MG 2.2 07/28/2022    TOTBILI 0.6 01/12/2023    PO4 4.4 07/28/2022      Coagulation:   Lab Results   Component Value Date    PT 11.8 07/17/2022    INR 1.1 07/17/2022     Echo 03/13/2022  LVEF 65-70%. No evidence of valvular abnormalities on spectral doppler. Consider TEE for improved sensitivity for detection of valvular vegetations if clinically indicated. No pericardial effusion     TEE 03/16/2022    No echocardiographic evidence of endocarditis.    No valvular vegetations or significant valvular regurgitation noted.    Normal biventricular systolic function with an estimated left ventricular ejection fraction of 60%.    No pericardial effusion.       PAC Plan    Anesthesia Plan    ASA score: 3   Plan: general  Induction method: intravenous  NPO status: acceptable      Informed Consent  Anesthetic plan and risks discussed with  patient.  Use of blood products discussed with patient  Blood Consent: consented      Plan discussed with: anesthesiologist, CRNA and surgeon/proceduralist.      Alerts: No

## 2023-01-14 ENCOUNTER — Encounter: Admit: 2023-01-14 | Discharge: 2023-01-14 | Payer: BC Managed Care – HMO

## 2023-01-14 DIAGNOSIS — T148XXA Other injury of unspecified body region, initial encounter: Secondary | ICD-10-CM

## 2023-01-14 MED ORDER — LEVOFLOXACIN 750 MG PO TAB
750 mg | ORAL_TABLET | Freq: Every day | ORAL | 0 refills | 7.00000 days | Status: AC
Start: 2023-01-14 — End: ?

## 2023-01-14 MED ADMIN — SODIUM CHLORIDE 0.9 % IV SOLP [27838]: 1500 mg | INTRAVENOUS | @ 08:00:00 | Stop: 2023-01-14 | NDC 00338004902

## 2023-01-14 MED ADMIN — ACETAMINOPHEN 500 MG PO TAB [102]: 1000 mg | ORAL | @ 01:00:00 | NDC 00904673061

## 2023-01-14 MED ADMIN — CARVEDILOL 6.25 MG PO TAB [77309]: 6.25 mg | ORAL | @ 02:00:00 | NDC 00904630161

## 2023-01-14 MED ADMIN — TORSEMIDE 20 MG PO TAB [18293]: 40 mg | ORAL | @ 14:00:00 | Stop: 2023-01-14 | NDC 68084053911

## 2023-01-14 MED ADMIN — ALLOPURINOL 100 MG PO TAB [310]: 300 mg | ORAL | @ 14:00:00 | Stop: 2023-01-14 | NDC 00904704161

## 2023-01-14 MED ADMIN — PANTOPRAZOLE 20 MG PO TBEC [79463]: 40 mg | ORAL | @ 14:00:00 | Stop: 2023-01-14 | NDC 50268058511

## 2023-01-14 MED ADMIN — SODIUM CHLORIDE 0.9 % IV PGBK (MB+) [95161]: 2 g | INTRAVENOUS | @ 06:00:00 | Stop: 2023-01-14 | NDC 00338915930

## 2023-01-14 MED ADMIN — CEFEPIME 2 GRAM IJ SOLR [78195]: 2 g | INTRAVENOUS | @ 06:00:00 | Stop: 2023-01-14 | NDC 60505614700

## 2023-01-14 MED ADMIN — CARVEDILOL 6.25 MG PO TAB [77309]: 6.25 mg | ORAL | @ 14:00:00 | Stop: 2023-01-14 | NDC 00904630161

## 2023-01-14 MED ADMIN — OXYCODONE 5 MG PO TAB [10814]: 5 mg | ORAL | @ 01:00:00 | NDC 42858000110

## 2023-01-14 MED ADMIN — SODIUM CHLORIDE 0.9 % IV PGBK (MB+) [95161]: 2 g | INTRAVENOUS | @ 14:00:00 | Stop: 2023-01-14 | NDC 00338915930

## 2023-01-14 MED ADMIN — ENOXAPARIN 40 MG/0.4 ML SC SYRG [85052]: 40 mg | SUBCUTANEOUS | @ 14:00:00 | Stop: 2023-01-14 | NDC 00781324602

## 2023-01-14 MED ADMIN — ONDANSETRON HCL (PF) 4 MG/2 ML IJ SOLN [136012]: 4 mg | INTRAVENOUS | NDC 60505613000

## 2023-01-14 MED ADMIN — MAGNESIUM OXIDE 400 MG (241.3 MG MAGNESIUM) PO TAB [10491]: 400 mg | ORAL | @ 02:00:00 | NDC 10006070028

## 2023-01-14 MED ADMIN — CEFEPIME 2 GRAM IJ SOLR [78195]: 2 g | INTRAVENOUS | @ 14:00:00 | Stop: 2023-01-14 | NDC 60505614700

## 2023-01-14 MED ADMIN — OXYCODONE 5 MG PO TAB [10814]: 5 mg | ORAL | @ 12:00:00 | Stop: 2023-01-14 | NDC 42858000110

## 2023-01-14 MED ADMIN — POTASSIUM CHLORIDE 20 MEQ PO TBTQ [35943]: 60 meq | ORAL | @ 14:00:00 | Stop: 2023-01-14 | NDC 00832532510

## 2023-01-14 MED ADMIN — TORSEMIDE 20 MG PO TAB [18293]: 40 mg | ORAL | @ 02:00:00 | NDC 68084053911

## 2023-01-14 MED ADMIN — VANCOMYCIN 1.5 GRAM IV SOLR [338456]: 1500 mg | INTRAVENOUS | @ 08:00:00 | Stop: 2023-01-14 | NDC 00409351511

## 2023-01-14 MED ADMIN — ACETAMINOPHEN 500 MG PO TAB [102]: 1000 mg | ORAL | @ 14:00:00 | Stop: 2023-01-14 | NDC 00904673061

## 2023-01-14 MED ADMIN — MAGNESIUM OXIDE 400 MG (241.3 MG MAGNESIUM) PO TAB [10491]: 400 mg | ORAL | @ 14:00:00 | Stop: 2023-01-14 | NDC 10006070028

## 2023-01-14 MED ADMIN — POTASSIUM CHLORIDE 20 MEQ PO TBTQ [35943]: 60 meq | ORAL | @ 02:00:00 | NDC 00832532511

## 2023-01-14 MED FILL — AMOXICILLIN-POT CLAVULANATE 875-125 MG PO TAB: 875/125 mg | ORAL | 14 days supply | Qty: 28 | Fill #1 | Status: CP

## 2023-01-14 MED FILL — OXYCODONE 5 MG PO TAB: 5 mg | ORAL | 4 days supply | Qty: 16 | Fill #1 | Status: CP

## 2023-01-14 NOTE — Telephone Encounter
PO ID Discharge Reconciliation Note  Hosp DC Date: 01/14/23    ID Dr. Mikki Harbor    ID f/u: TBD  EKG: 6/24 or 6/25 to be completed at PCP's office    Dx: osteomyelitis of clavicle    PO Abx:   --Augmentin 875/125mg  PO BID  End date: 01/26/23    --Levofloxacin 750mg  PO daily  End date: 01/26/23    Confirmed patient picked up augmentin from [facility] pharmacy. Called in levofloxacin to patient's [facility] pharmacy. Also let him know that Dr. Mikki Harbor is requesting an EKG on Monday or Tuesday next week. Called patient's PCP and left voicemail requesting call back to determine if they are able to perform this.

## 2023-01-15 ENCOUNTER — Encounter: Admit: 2023-01-15 | Discharge: 2023-01-15 | Payer: BC Managed Care – HMO

## 2023-01-15 DIAGNOSIS — T300 Burn of unspecified body region, unspecified degree: Secondary | ICD-10-CM

## 2023-01-15 DIAGNOSIS — I10 Essential (primary) hypertension: Secondary | ICD-10-CM

## 2023-01-15 DIAGNOSIS — E119 Type 2 diabetes mellitus without complications: Secondary | ICD-10-CM

## 2023-01-20 ENCOUNTER — Encounter: Admit: 2023-01-20 | Discharge: 2023-01-20 | Payer: BC Managed Care – HMO

## 2023-01-20 DIAGNOSIS — T148XXA Other injury of unspecified body region, initial encounter: Secondary | ICD-10-CM

## 2023-01-20 DIAGNOSIS — Z5181 Encounter for therapeutic drug level monitoring: Secondary | ICD-10-CM

## 2023-01-20 MED ORDER — FLUCONAZOLE 200 MG PO TAB
400 mg | ORAL_TABLET | Freq: Every day | ORAL | 0 refills | 3.00000 days | Status: AC
Start: 2023-01-20 — End: ?

## 2023-01-20 NOTE — Telephone Encounter
Attempted to call patient to go over results. Patient did not answer, left voicemail to return my call.   Jerene Pitch, RN

## 2023-01-20 NOTE — Progress Notes
QTc stable on antibiotics. Okay to continue levofloxacin. Culture now growing Candida albicans. Will start fluconazole 400 mg PO daily.  We can stop Augmentin as only Pseudomonas grew.    Hattie can you call Pt and inform him to Stop Augmentin and need to start Fluconazole. Let me know preferred pharmacy and I can send over script. We will will need repeat EKG 1-2 days after he starts the fluconazole. I believe he got this last one at his PCP's office, can you please help coordinate.

## 2023-01-20 NOTE — Telephone Encounter
Patient called back and went over new culture results. Went over medication changes and patient voiced understanding. He will start fluconazole tomorrow and obtain EKG Friday. Routing to Dr.[name] to add Fluconazole order. He would like sent to [facility] in [place].   Jerene Pitch, RN

## 2023-01-20 NOTE — Telephone Encounter
Called patient to find out if he had gotten EKG done. Patient voiced he had not and nobody told him when to get it done. I let him know the order was sent to his PCP office and to give them a call to set up an appointment. He voiced understanding and will call them.   Jerene Pitch, RN

## 2023-01-20 NOTE — Telephone Encounter
EKG received and entered.   Jerene Pitch, RN

## 2023-01-20 NOTE — Telephone Encounter
-----   Message from Jethro Bastos, MD sent at 01/20/2023 12:30 PM CDT -----  QTc stable on antibiotics. Okay to continue levofloxacin. Culture now growing Candida albicans. Will start fluconazole 400 mg PO daily.  We can stop Augmentin as only Pseudomonas grew.    Georg Ang can you call Pt and inform him to Stop Augmentin and need to start Fluconazole. Let me know preferred pharmacy and I can send over script. We will will need repeat EKG 1-2 days after he starts the fluconazole. I believe he got this last one at his PCP's office, can you please help coordinate.

## 2023-01-26 ENCOUNTER — Ambulatory Visit: Admit: 2023-01-26 | Discharge: 2023-01-26 | Payer: BC Managed Care – HMO

## 2023-01-26 ENCOUNTER — Encounter: Admit: 2023-01-26 | Discharge: 2023-01-26 | Payer: BC Managed Care – HMO

## 2023-01-26 DIAGNOSIS — T300 Burn of unspecified body region, unspecified degree: Secondary | ICD-10-CM

## 2023-01-26 DIAGNOSIS — I10 Essential (primary) hypertension: Secondary | ICD-10-CM

## 2023-01-26 DIAGNOSIS — E119 Type 2 diabetes mellitus without complications: Secondary | ICD-10-CM

## 2023-01-27 ENCOUNTER — Encounter: Admit: 2023-01-27 | Discharge: 2023-01-27 | Payer: BC Managed Care – HMO

## 2023-01-27 DIAGNOSIS — T300 Burn of unspecified body region, unspecified degree: Secondary | ICD-10-CM

## 2023-01-27 DIAGNOSIS — I10 Essential (primary) hypertension: Secondary | ICD-10-CM

## 2023-01-27 DIAGNOSIS — E119 Type 2 diabetes mellitus without complications: Secondary | ICD-10-CM

## 2023-02-09 ENCOUNTER — Ambulatory Visit: Admit: 2023-02-09 | Discharge: 2023-02-10 | Payer: BC Managed Care – HMO

## 2023-02-09 ENCOUNTER — Encounter: Admit: 2023-02-09 | Discharge: 2023-02-09 | Payer: BC Managed Care – HMO

## 2023-02-09 DIAGNOSIS — T8189XS Other complications of procedures, not elsewhere classified, sequela: Secondary | ICD-10-CM

## 2023-02-09 DIAGNOSIS — E119 Type 2 diabetes mellitus without complications: Secondary | ICD-10-CM

## 2023-02-09 DIAGNOSIS — S1190XD Unspecified open wound of unspecified part of neck, subsequent encounter: Secondary | ICD-10-CM

## 2023-02-09 DIAGNOSIS — I10 Essential (primary) hypertension: Secondary | ICD-10-CM

## 2023-02-09 DIAGNOSIS — T300 Burn of unspecified body region, unspecified degree: Secondary | ICD-10-CM

## 2023-02-09 DIAGNOSIS — E1169 Type 2 diabetes mellitus with other specified complication: Secondary | ICD-10-CM

## 2023-02-09 NOTE — Progress Notes
Subjective:       History of Present Illness  Jon Pope is a 62 y.o. male who is here for a follow-up visit after surgery for closure of chest wound on January 11, 2023.  Overall everything is healing well.  He is not having any issues with fever, drainage or swelling.  His brother who is accompanying him is concerned about 1 spot over the upper end of the flap where there is little bit uneven skin.  He has continued to refrain from strenuous activity.       Review of Systems   Constitutional: Negative.    HENT: Negative.     Eyes: Negative.    Respiratory: Negative.     Cardiovascular: Negative.    Gastrointestinal: Negative.    Endocrine: Negative.    Genitourinary: Negative.    Musculoskeletal: Negative.    Skin: Negative.    Allergic/Immunologic: Negative.    Neurological: Negative.    Hematological: Negative.    Psychiatric/Behavioral: Negative.         Objective:          acetaminophen (TYLENOL EXTRA STRENGTH) 500 mg tablet Take two tablets by mouth every 6 hours as needed. Max of 4,000 mg of acetaminophen in 24 hours.    allopurinoL (ZYLOPRIM) 300 mg tablet Take one tablet by mouth daily. Take with food.    amLODIPine (NORVASC) 5 mg tablet Take one tablet by mouth daily.    blood sugar diagnostic (GLUCOCARD EXPRESSION) test strip Use one strip as directed three times daily before meals. ICD-10: Type 2 diabetes with hyperglycemia, insulin use E11.65, Z79.4    blood-glucose meter kit ICD-10: Type 2 diabetes with hyperglycemia, insulin use E11.65, Z79.4    carvediloL (COREG) 6.25 mg tablet Take one tablet by mouth twice daily. Take with food.    fluconazole (DIFLUCAN) 200 mg tablet Take two tablets by mouth daily.    glucose (DEX4 GLUCOSE) 4 gram chewable tablet Chew four tablets by mouth as Needed.    insulin aspart (U-100) (NOVOLOG FLEXPEN U-100 INSULIN) 100 unit/mL (3 mL) PEN Inject twelve Units under the skin three times daily with meals.    insulin detemir U-100 (LEVEMIR FLEXTOUCH) 100 unit/mL (3 mL) injection pen Inject sixty Units under the skin every morning.    lancets MISC Use one each as directed three times daily before meals. ICD-10: Type 2 diabetes with hyperglycemia, insulin use E11.65, Z79.4    magnesium oxide (MAGOX) 400 mg (241.3 mg magnesium) tablet Take one tablet by mouth twice daily.    oxyCODONE (ROXICODONE) 5 mg tablet Take one tablet by mouth every 6 hours as needed. Indications: pain    pantoprazole DR (PROTONIX) 40 mg tablet Take one tablet by mouth daily.    pen needle, diabetic (TECHLITE PEN NEEDLE) 32 gauge x 5/32 pen needle Use one each as directed as Needed.    polyethylene glycol 3350 (MIRALAX) 17 g packet Take one packet by mouth daily as needed.    potassium chloride SR (KLOR-CON M20) 20 mEq tablet Take three tablets by mouth twice daily.    semaglutide (OZEMPIC) 0.25 mg or 0.5 mg(2 mg/1.5 mL) injection PEN Inject one-half mg under the skin every 7 days.    sennosides-docusate sodium (SENOKOT-S) 8.6/50 mg tablet Take two tablets by mouth daily as needed.    torsemide (DEMADEX) 20 mg tablet Take two tablets by mouth twice daily.     Vitals:    02/09/23 0855   PainSc: Zero   Weight: 120.2 kg (  265 lb)   Height: 172.7 cm (5' 8)     Body mass index is 40.29 kg/m?Marland Kitchen     Physical Exam    Local:   Healing surgical site over upper chest and neck, all incisions are healing well, no visible drainage or dehiscence     Assessment and Plan:    Removed most of the sutures  Keep the area covered with clean Band-Aid  Hold off starting strenuous activity for additional 2 to 3 weeks  Continue antibiotics per Dr. Mikki Harbor  Return to office in about 3 weeks for additional assessment of the healing and removal of remaining 2 sutures

## 2023-02-09 NOTE — Patient Instructions
Removed most of the sutures  Keep the area covered with clean Band-Aid  Hold off starting strenuous activity for additional 2 to 3 weeks  Continue antibiotics per Dr. Mikki Harbor  Return to office in about 3 weeks for additional assessment of the healing and removal of remaining 2 sutures

## 2023-03-03 ENCOUNTER — Encounter: Admit: 2023-03-03 | Discharge: 2023-03-03 | Payer: BC Managed Care – HMO

## 2023-03-04 ENCOUNTER — Encounter: Admit: 2023-03-04 | Discharge: 2023-03-04 | Payer: BC Managed Care – HMO

## 2023-03-04 ENCOUNTER — Ambulatory Visit: Admit: 2023-03-04 | Discharge: 2023-03-04 | Payer: BC Managed Care – HMO

## 2023-03-04 DIAGNOSIS — I10 Essential (primary) hypertension: Secondary | ICD-10-CM

## 2023-03-04 DIAGNOSIS — T8189XS Other complications of procedures, not elsewhere classified, sequela: Secondary | ICD-10-CM

## 2023-03-04 DIAGNOSIS — S1190XD Unspecified open wound of unspecified part of neck, subsequent encounter: Secondary | ICD-10-CM

## 2023-03-04 DIAGNOSIS — E119 Type 2 diabetes mellitus without complications: Secondary | ICD-10-CM

## 2023-03-04 DIAGNOSIS — T300 Burn of unspecified body region, unspecified degree: Secondary | ICD-10-CM

## 2023-03-04 DIAGNOSIS — M869 Osteomyelitis, unspecified: Secondary | ICD-10-CM

## 2023-03-04 NOTE — Progress Notes
Date of Service: 03/04/2023    Subjective:             Jon Pope is a 62 y.o. male.    History of Present Illness  Jon Pope is here for a follow-up visit about 6 weeks after surgical closure of right upper chest wound.  Everything is healed well.  There are no concerns.           Objective:         acetaminophen (TYLENOL EXTRA STRENGTH) 500 mg tablet Take two tablets by mouth every 6 hours as needed. Max of 4,000 mg of acetaminophen in 24 hours.    allopurinoL (ZYLOPRIM) 300 mg tablet Take one tablet by mouth daily. Take with food.    amLODIPine (NORVASC) 5 mg tablet Take one tablet by mouth daily.    blood sugar diagnostic (GLUCOCARD EXPRESSION) test strip Use one strip as directed three times daily before meals. ICD-10: Type 2 diabetes with hyperglycemia, insulin use E11.65, Z79.4    blood-glucose meter kit ICD-10: Type 2 diabetes with hyperglycemia, insulin use E11.65, Z79.4    carvediloL (COREG) 6.25 mg tablet Take one tablet by mouth twice daily. Take with food.    fluconazole (DIFLUCAN) 200 mg tablet Take two tablets by mouth daily.    glucose (DEX4 GLUCOSE) 4 gram chewable tablet Chew four tablets by mouth as Needed.    insulin aspart (U-100) (NOVOLOG FLEXPEN U-100 INSULIN) 100 unit/mL (3 mL) PEN Inject twelve Units under the skin three times daily with meals.    insulin detemir U-100 (LEVEMIR FLEXTOUCH) 100 unit/mL (3 mL) injection pen Inject sixty Units under the skin every morning.    lancets MISC Use one each as directed three times daily before meals. ICD-10: Type 2 diabetes with hyperglycemia, insulin use E11.65, Z79.4    magnesium oxide (MAGOX) 400 mg (241.3 mg magnesium) tablet Take one tablet by mouth twice daily.    oxyCODONE (ROXICODONE) 5 mg tablet Take one tablet by mouth every 6 hours as needed. Indications: pain    pantoprazole DR (PROTONIX) 40 mg tablet Take one tablet by mouth daily.    pen needle, diabetic (TECHLITE PEN NEEDLE) 32 gauge x 5/32 pen needle Use one each as directed as Needed. polyethylene glycol 3350 (MIRALAX) 17 g packet Take one packet by mouth daily as needed.    potassium chloride SR (KLOR-CON M20) 20 mEq tablet Take three tablets by mouth twice daily.    semaglutide (OZEMPIC) 0.25 mg or 0.5 mg(2 mg/1.5 mL) injection PEN Inject one-half mg under the skin every 7 days.    sennosides-docusate sodium (SENOKOT-S) 8.6/50 mg tablet Take two tablets by mouth daily as needed.    torsemide (DEMADEX) 20 mg tablet Take two tablets by mouth twice daily.     Vitals:    03/04/23 1509   BP: 126/75   Pulse: 63   PainSc: Zero   Weight: 120.2 kg (265 lb)   Height: 172.7 cm (5' 8)     Body mass index is 40.29 kg/m?Marland Kitchen     Physical Exam    Local : Well-healed flap, no dehiscence or infection     Assessment and Plan:  Removed remaining sutures  No dressing needed as everything is healed perfectly  Following recommendations from infectious disease regarding any further care plan  gradual return to work, hold off strenuous work for additional 2 weeks  Follow-up as needed

## 2023-04-08 ENCOUNTER — Encounter: Admit: 2023-04-08 | Discharge: 2023-04-08 | Payer: BC Managed Care – HMO

## 2023-04-08 ENCOUNTER — Ambulatory Visit: Admit: 2023-04-08 | Discharge: 2023-04-09 | Payer: BC Managed Care – HMO

## 2023-04-08 DIAGNOSIS — T8189XS Other complications of procedures, not elsewhere classified, sequela: Secondary | ICD-10-CM

## 2023-04-08 NOTE — Progress Notes
Patient presented to the clinic today for a check of his incision because he stated that he could feel a suture. Upon examination, there was no obvious suture to remove and patient reported that it was not sticking up like it did they day prior, due to this no sutures were removed. RN provided Aquaphor to apply over suture line BID and educated patient and his family member on how to cut/remove suture if it becomes troublesome again. Patient v/u. Follow-up PRN.

## 2023-07-16 IMAGING — CT CHESTW
2 of 4 series · 11 of 36 positions shown, 13 images · non-contrast
Comparison: none

[Series 2: thorax ax 1.50 br40 s3 · axial · 0.80mm/px · z∈[+1445,+1709]mm · 8 of 210 slices shown, 10 images]
[im 17/210  mediastinal]
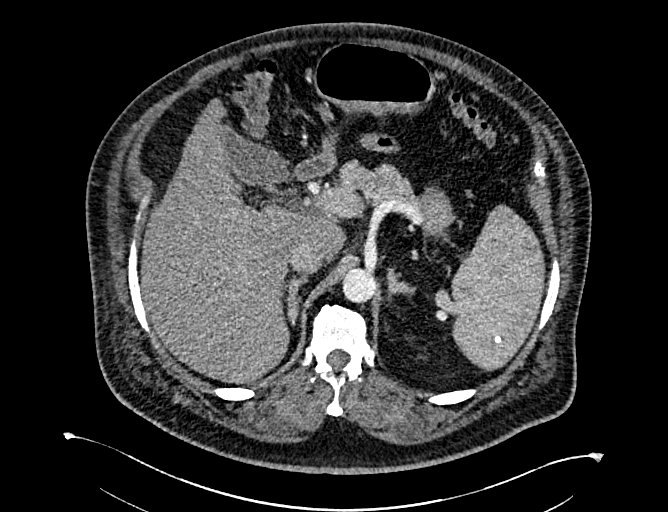
[im 17/210  lung]
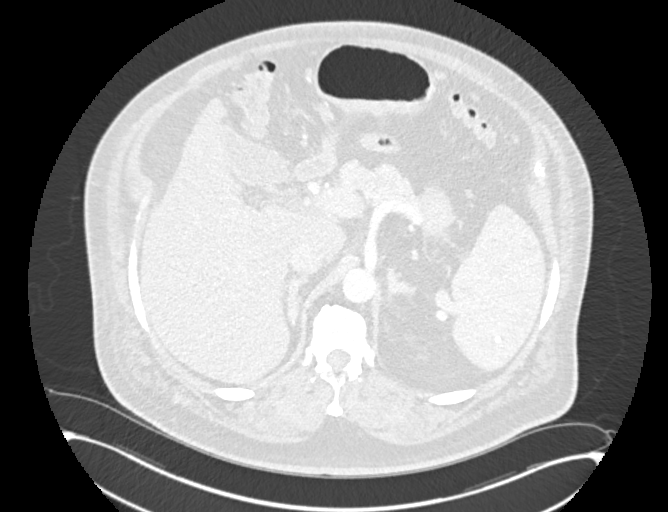
[im 49/210  lung]
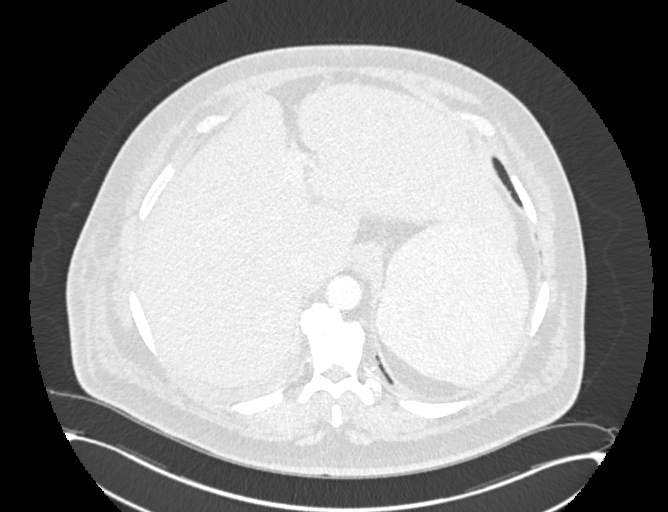
[im 65/210  lung]
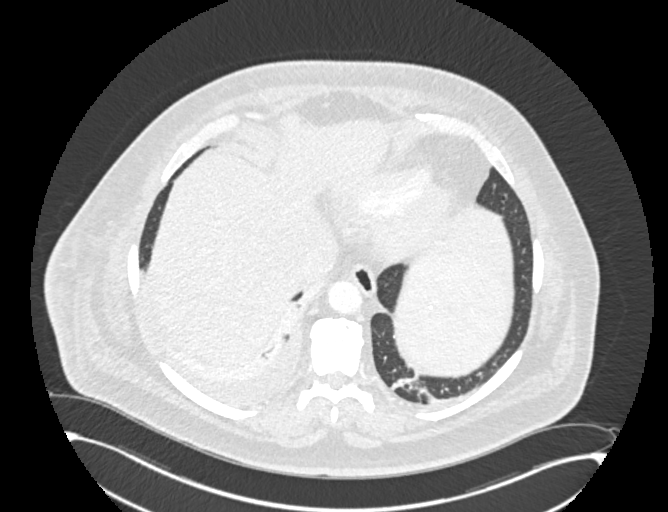
[im 97/210  lung]
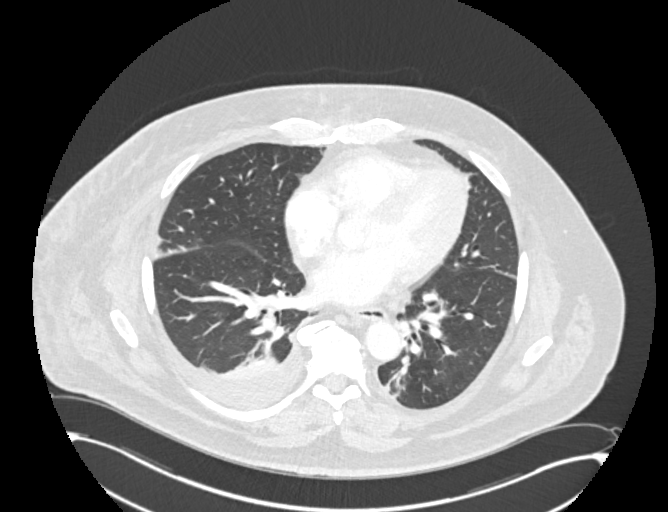
[im 113/210  mediastinal]
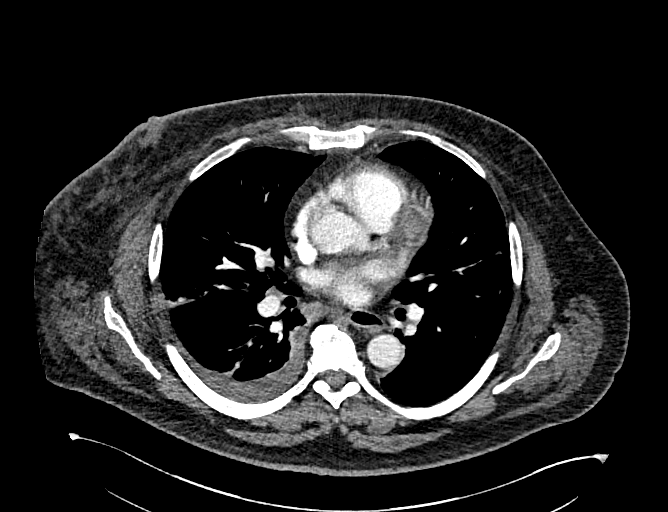
[im 113/210  lung]
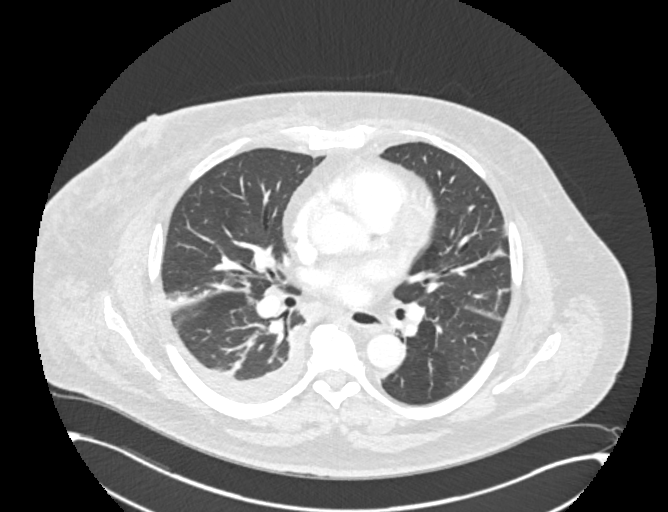
[im 145/210  lung]
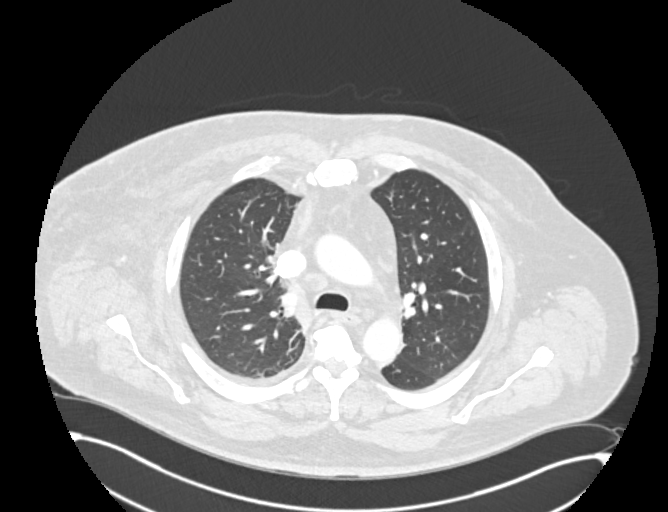
[im 161/210  lung]
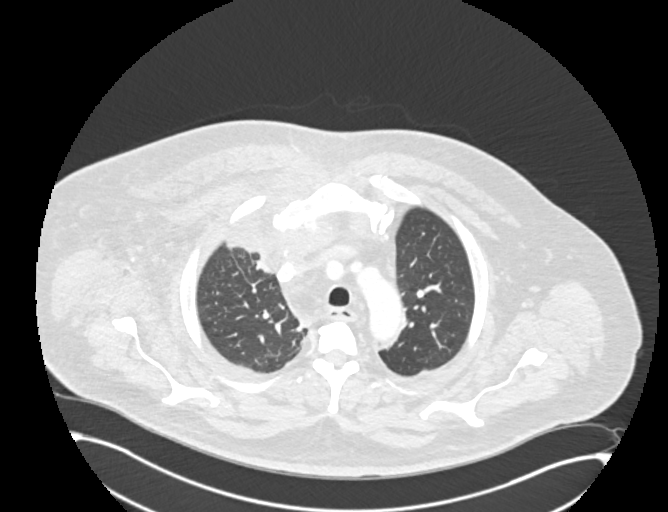
[im 193/210  lung]
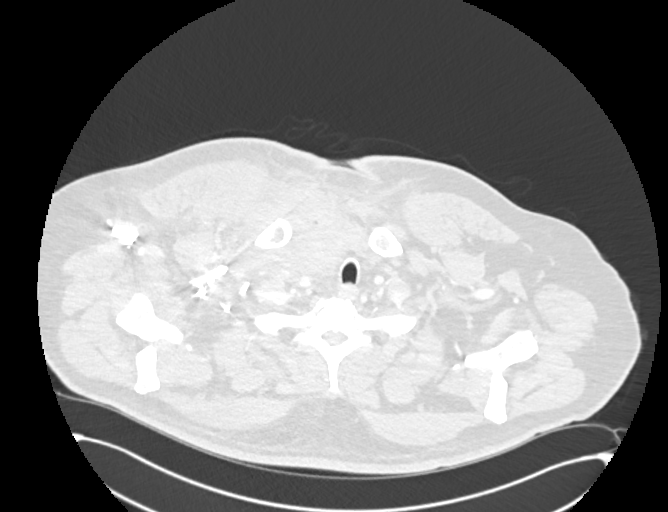

[Series 4: thorax cor 1.50 br40 s3 · coronal · 0.62mm/px · 3 of 274 slices shown]
[im 55/274  lung]
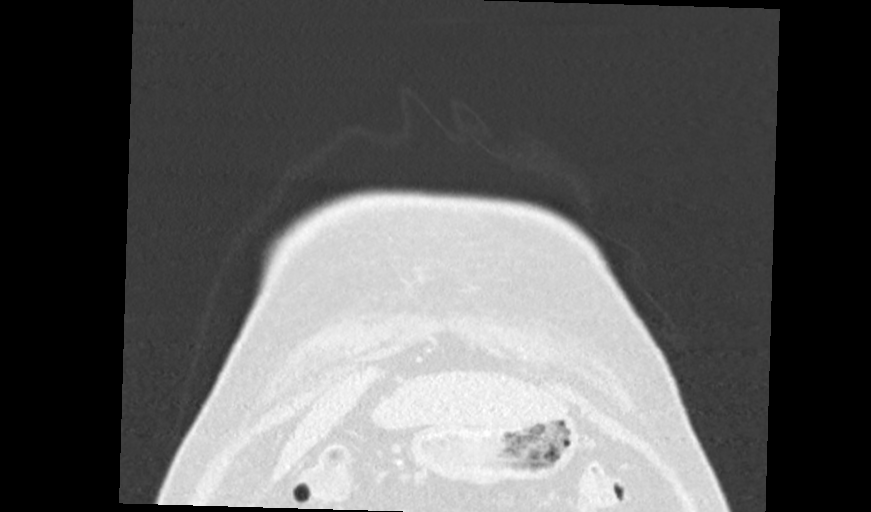
[im 110/274  lung]
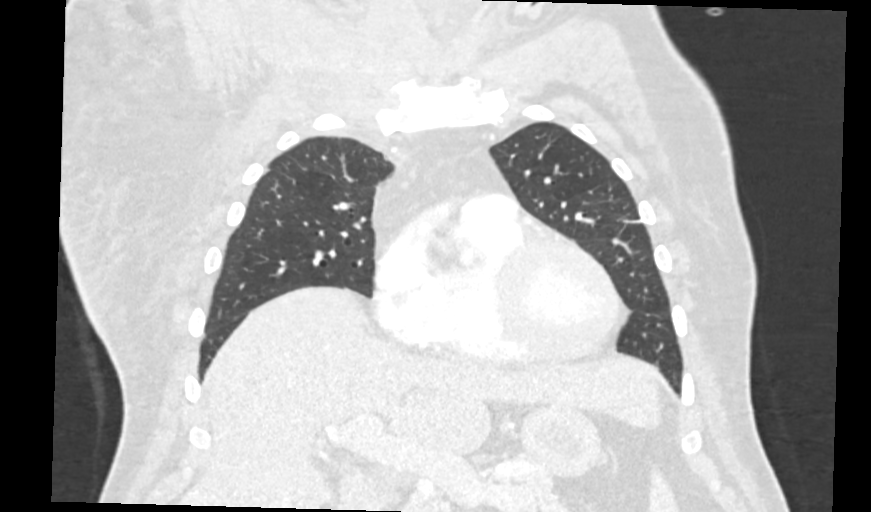
[im 164/274  lung]
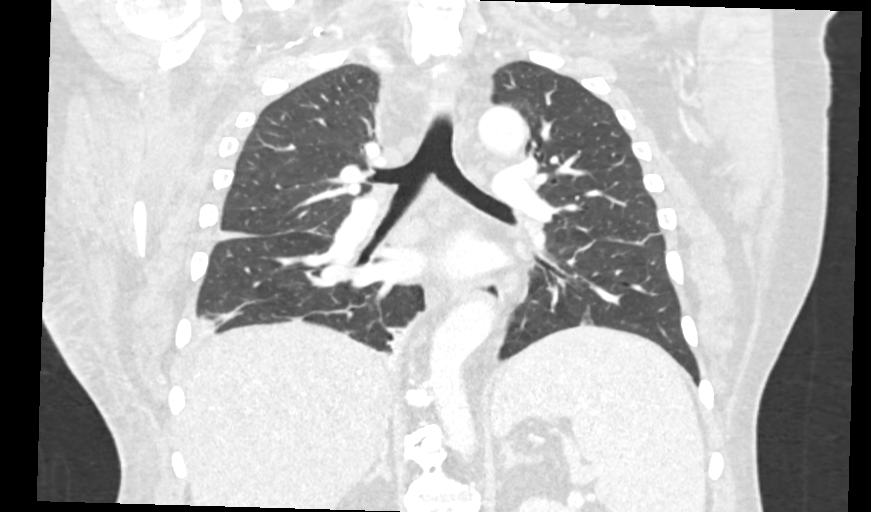

[11 of 36 positions shown; findings below may reference images not displayed]

DIAGNOSTIC STUDIES

EXAM

COMPUTED TOMOGRAPHY, THORAX WITH CONTRAST MATERIAL CPT 56974; COMPUTED TOMOGRAPHY, ABDOMEN AND
PELVIS WITH CONTRAST MATERIAL CPT 09600

INDICATION

Pneumonia, bacteremia, abnormal mri
Pneumonia, bacteremia, and abnormal MRI. Creat: 0.73 GFR: 116.5. Given 833mls omnipaque 300 via iv.
CT/NM 2/0. TB

TECHNIQUE

Contiguous axial tomographic images were obtained from the lung apices to the symphysis pubis with
both oral and intravenous contrast.  contrast. Coronal and sagittal reformatted imaging was
performed.

All CT scans at this facility use dose modulation, iterative reconstruction, and/or weight based
dosing when appropriate to reduce radiation dose to as low as reasonably achievable.

COMPARISONS

CT soft tissue neck 03/09/2022.

FINDINGS

Chest: The heart appears normal in size. No pericardial effusion. Mild motion artifact at the aortic
root. Otherwise, no definite aortic aneurysm or dissection. No central pulmonary embolism. Limited
evaluation for the detection of segmental and subsegmental pulmonary emboli. No pathologically
enlarged lymph nodes. The thyroid gland is normal in size. There is inflammatory stranding with
muscle edema involving the right-sided the neck extending into the anterior mediastinum. There is
soft tissue stranding and edema encasing the manubrium of the sternum. There are foci of air within
the upper anterior mediastinum. There are tiny erosions with associated sclerosis involving the
posterior cortex the manubrium. A discrete rim enhancing fluid collection is not yet seen. Small
right-sided pleural effusion. There are mild centrilobular emphysematous changes. Bibasilar
platelike atelectasis versus scarring. There is extension of the inflammatory response into the
right lung apex. Right basilar compressive atelectasis.

Abdomen and pelvis: Hepatomegaly, 23.8 cm, with moderate steatosis. There is mild hepatic surface
nodularity. The spleen is enlarged, 18 cm. The pancreas and adrenal glands appear grossly
unremarkable. Cholelithiasis. No hydronephrosis. No bowel obstruction or free air. Normal appendix.
Mild gastric mucosal thickening. No significant small bowel dilatation. Mild atherosclerotic disease
within the abdominal aorta. Subcentimeter retroperitoneal nodes. There is a 1.2 cm right external
iliac node. Tiny fat containing umbilical hernia. The urinary bladder contains contrast. The
prostate and seminal vesicles appear grossly unremarkable. Intramedullary rod within the left femur.
Severe degenerative changes within the lower lumbar spine.

IMPRESSION

1. Phlegmon involving the right-side of the neck encasing the manubrium of the sternum and extending
into the anterior mediastinum and right lung apex. There are foci of air within the anterior
mediastinum. Sclerosis and erosions involving the posterior cortex of the manubrium.

Findings are suspicious for osteomyelitis/mediastinitis.

The report was called to Dr. Kidachi on 03/13/2022 at [DATE] p.m..

Tech Notes:

Pneumonia, bacteremia, and abnormal MRI. Creat: 0.73 GFR: 116.5. Given 833mls omnipaque 300 via iv.
CT/NM 2/0. TB

## 2023-07-16 IMAGING — CT ABDPELW
2 of 3 series · 14 of 46 positions shown, 16 images · non-contrast
Comparison: none

[Series 2: abdomen ax 3.00 br40 s3 · axial · 0.80mm/px · z∈[+1146,+1569]mm · 11 of 163 slices shown, 13 images]
[im 11/163  soft-tissue]
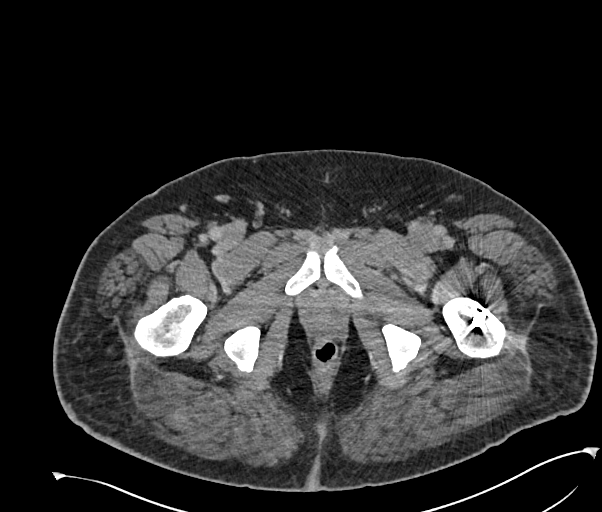
[im 11/163  bone]
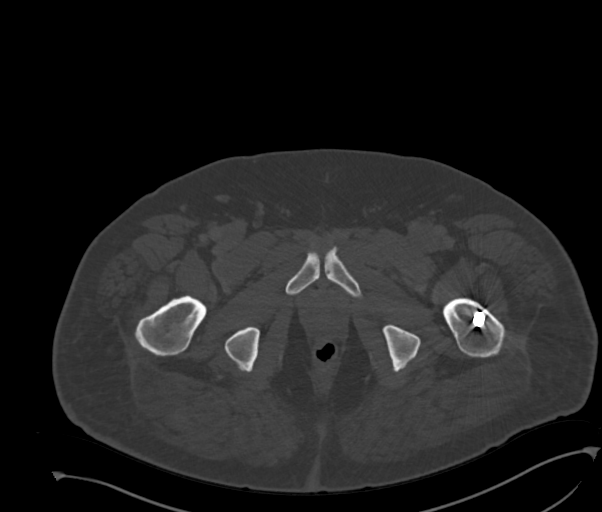
[im 27/163  soft-tissue]
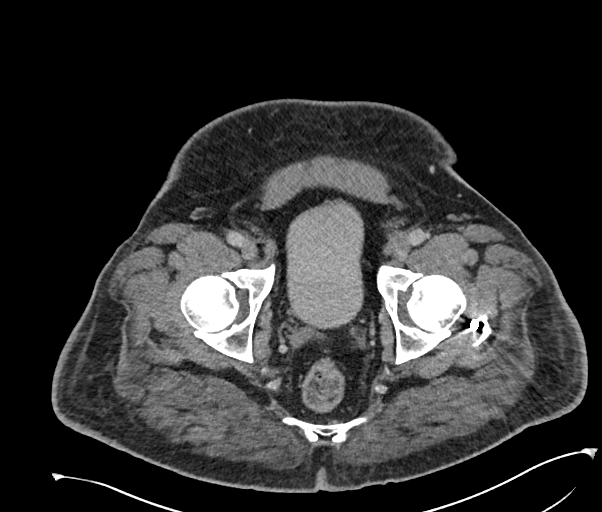
[im 37/163  soft-tissue]
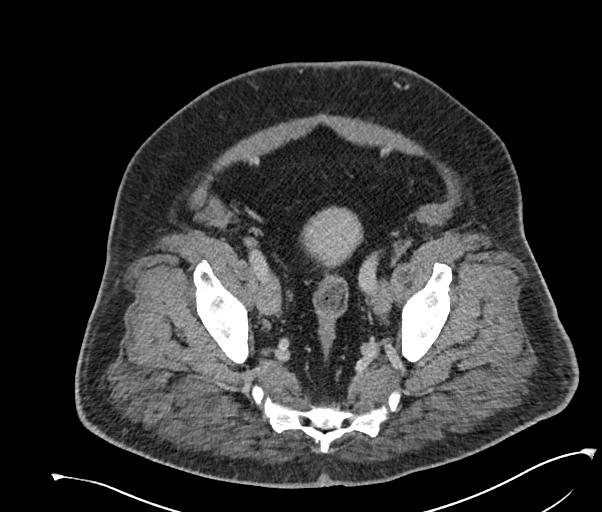
[im 53/163  soft-tissue]
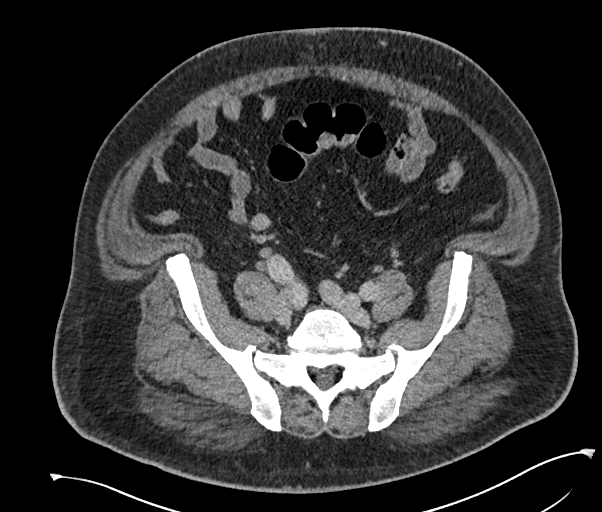
[im 68/163  soft-tissue]
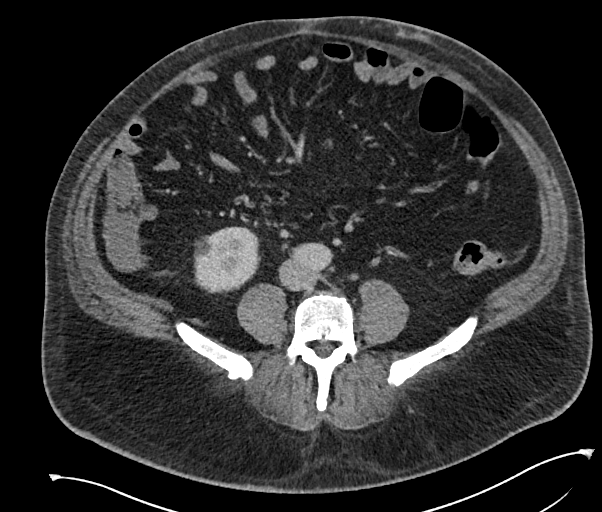
[im 84/163  soft-tissue]
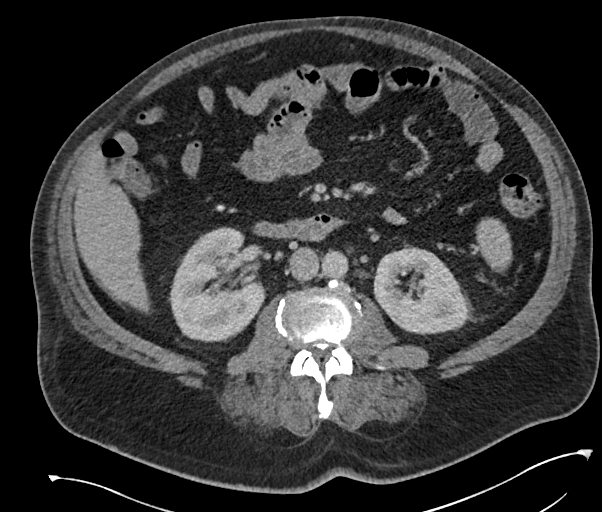
[im 95/163  soft-tissue]
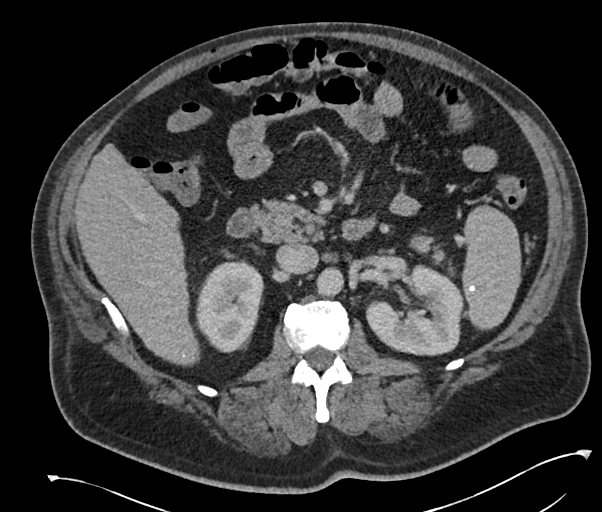
[im 110/163  soft-tissue]
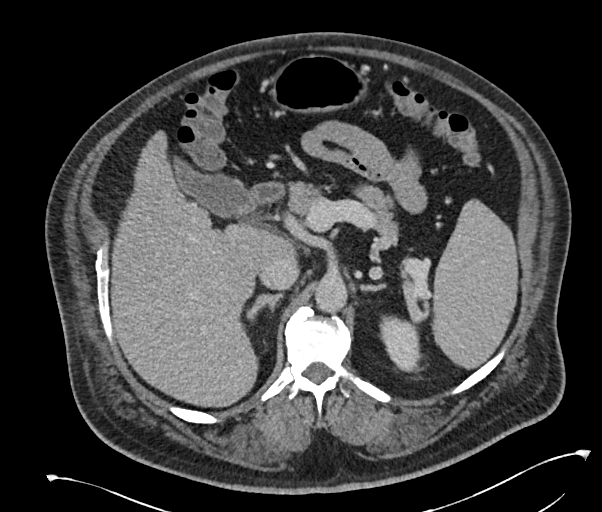
[im 126/163  soft-tissue]
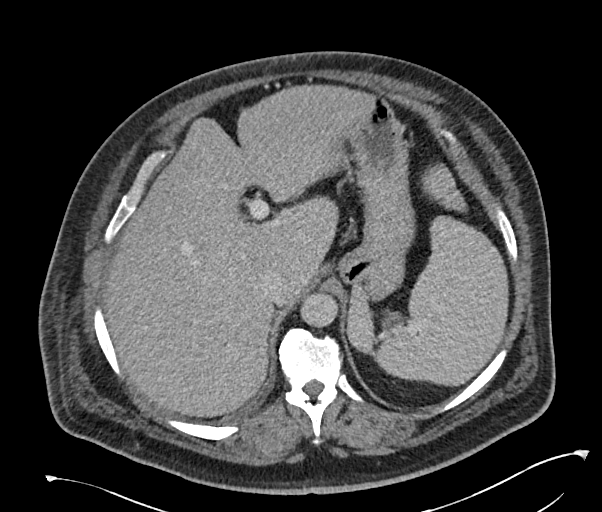
[im 126/163  bone]
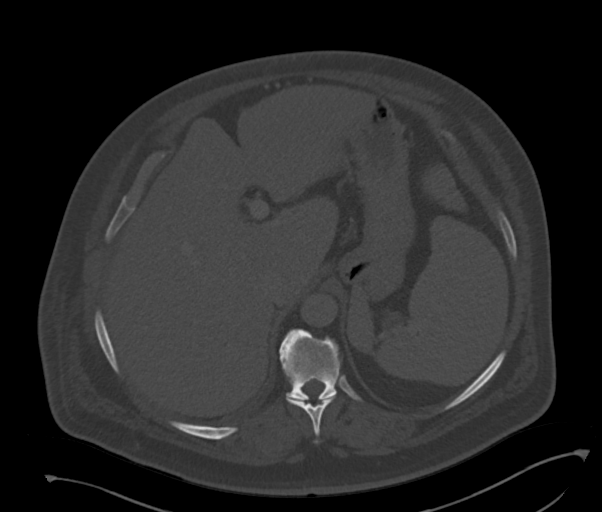
[im 136/163  soft-tissue]
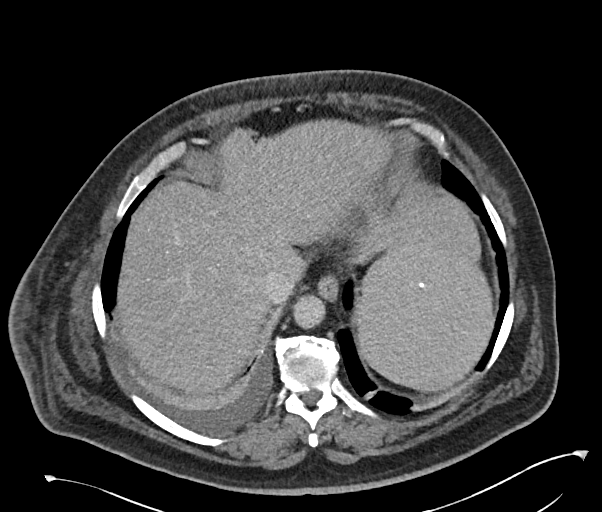
[im 152/163  soft-tissue]
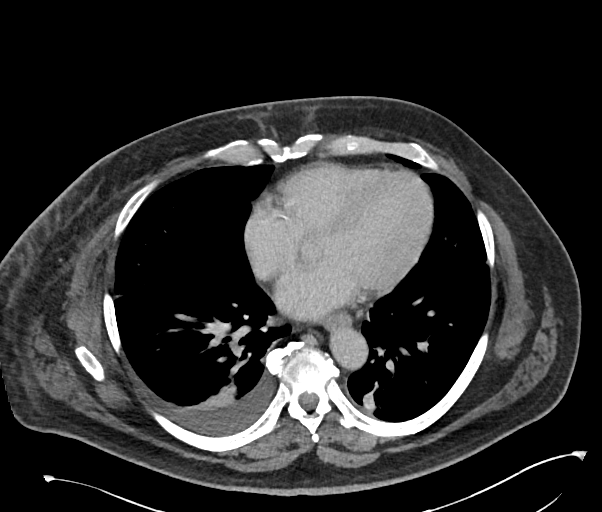

[Series 4: abdomen cor 3.00 br40 s3 · coronal · 0.94mm/px · 3 of 135 slices shown]
[im 45/135  soft-tissue]
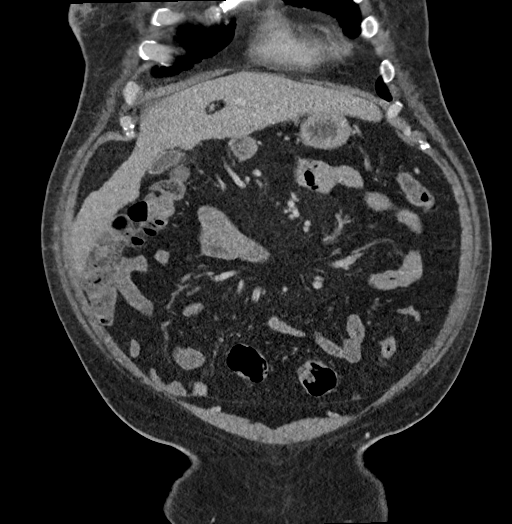
[im 60/135  soft-tissue]
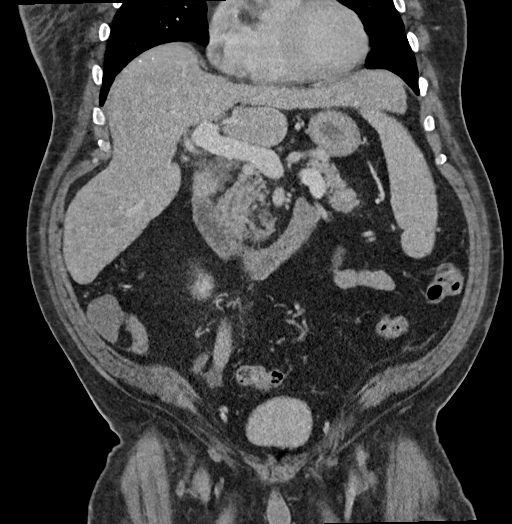
[im 75/135  soft-tissue]
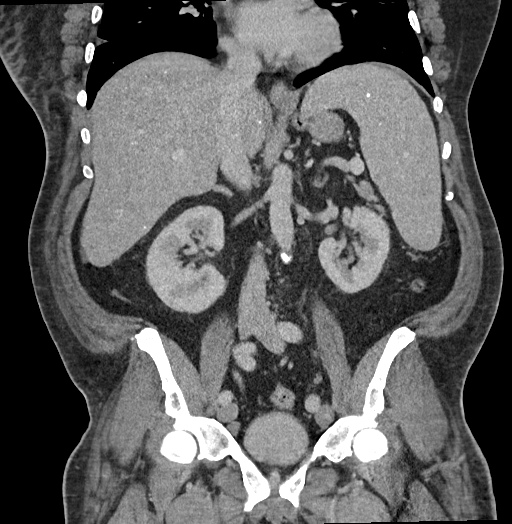

[14 of 46 positions shown; findings below may reference images not displayed]

DIAGNOSTIC STUDIES

EXAM

COMPUTED TOMOGRAPHY, THORAX WITH CONTRAST MATERIAL CPT 34054; COMPUTED TOMOGRAPHY, ABDOMEN AND
PELVIS WITH CONTRAST MATERIAL CPT 70777

INDICATION

Pneumonia, bacteremia, abnormal mri
Pneumonia, bacteremia, and abnormal MRI. Creat: 0.73 GFR: 116.5. Given 966mls omnipaque 300 via iv.
CT/NM 2/0. TB

TECHNIQUE

Contiguous axial tomographic images were obtained from the lung apices to the symphysis pubis with
both oral and intravenous contrast.  contrast. Coronal and sagittal reformatted imaging was
performed.

All CT scans at this facility use dose modulation, iterative reconstruction, and/or weight based
dosing when appropriate to reduce radiation dose to as low as reasonably achievable.

COMPARISONS

CT soft tissue neck 03/09/2022.

FINDINGS

Chest: The heart appears normal in size. No pericardial effusion. Mild motion artifact at the aortic
root. Otherwise, no definite aortic aneurysm or dissection. No central pulmonary embolism. Limited
evaluation for the detection of segmental and subsegmental pulmonary emboli. No pathologically
enlarged lymph nodes. The thyroid gland is normal in size. There is inflammatory stranding with
muscle edema involving the right-sided the neck extending into the anterior mediastinum. There is
soft tissue stranding and edema encasing the manubrium of the sternum. There are foci of air within
the upper anterior mediastinum. There are tiny erosions with associated sclerosis involving the
posterior cortex the manubrium. A discrete rim enhancing fluid collection is not yet seen. Small
right-sided pleural effusion. There are mild centrilobular emphysematous changes. Bibasilar
platelike atelectasis versus scarring. There is extension of the inflammatory response into the
right lung apex. Right basilar compressive atelectasis.

Abdomen and pelvis: Hepatomegaly, 23.8 cm, with moderate steatosis. There is mild hepatic surface
nodularity. The spleen is enlarged, 18 cm. The pancreas and adrenal glands appear grossly
unremarkable. Cholelithiasis. No hydronephrosis. No bowel obstruction or free air. Normal appendix.
Mild gastric mucosal thickening. No significant small bowel dilatation. Mild atherosclerotic disease
within the abdominal aorta. Subcentimeter retroperitoneal nodes. There is a 1.2 cm right external
iliac node. Tiny fat containing umbilical hernia. The urinary bladder contains contrast. The
prostate and seminal vesicles appear grossly unremarkable. Intramedullary rod within the left femur.
Severe degenerative changes within the lower lumbar spine.

IMPRESSION

1. Phlegmon involving the right-side of the neck encasing the manubrium of the sternum and extending
into the anterior mediastinum and right lung apex. There are foci of air within the anterior
mediastinum. Sclerosis and erosions involving the posterior cortex of the manubrium.

Findings are suspicious for osteomyelitis/mediastinitis.

The report was called to Dr. Yiotis on 03/13/2022 at [DATE] p.m..

Tech Notes:

Bacteremia, and abnormal MRI. Creat: 0.73 GFR: 116.5. Given 966mls omnipaque [address] via iv[address]/NM 2/0.
TB

## 2023-09-03 ENCOUNTER — Encounter: Admit: 2023-09-03 | Discharge: 2023-09-03 | Payer: BC Managed Care – HMO
